# Patient Record
Sex: Female | Born: 1935 | Race: White | Hispanic: No | Marital: Married | Smoking: Never smoker
Health system: Southern US, Community
[De-identification: ages and names within clinical notes are randomized; demographics above are authoritative.]

## PROBLEM LIST (undated history)

## (undated) ENCOUNTER — Emergency Department (HOSPITAL_COMMUNITY): Payer: Medicare Other

## (undated) DIAGNOSIS — T7840XA Allergy, unspecified, initial encounter: Secondary | ICD-10-CM

## (undated) DIAGNOSIS — E039 Hypothyroidism, unspecified: Secondary | ICD-10-CM

## (undated) DIAGNOSIS — E0500 Thyrotoxicosis with diffuse goiter without thyrotoxic crisis or storm: Secondary | ICD-10-CM

## (undated) DIAGNOSIS — M810 Age-related osteoporosis without current pathological fracture: Secondary | ICD-10-CM

## (undated) DIAGNOSIS — Z8679 Personal history of other diseases of the circulatory system: Secondary | ICD-10-CM

## (undated) DIAGNOSIS — Z8619 Personal history of other infectious and parasitic diseases: Secondary | ICD-10-CM

## (undated) DIAGNOSIS — Z8669 Personal history of other diseases of the nervous system and sense organs: Secondary | ICD-10-CM

## (undated) DIAGNOSIS — I10 Essential (primary) hypertension: Secondary | ICD-10-CM

## (undated) DIAGNOSIS — H409 Unspecified glaucoma: Secondary | ICD-10-CM

## (undated) DIAGNOSIS — I739 Peripheral vascular disease, unspecified: Secondary | ICD-10-CM

## (undated) DIAGNOSIS — F29 Unspecified psychosis not due to a substance or known physiological condition: Secondary | ICD-10-CM

## (undated) HISTORY — DX: Essential (primary) hypertension: I10

## (undated) HISTORY — DX: Hypothyroidism, unspecified: E03.9

## (undated) HISTORY — PX: ABDOMINAL HYSTERECTOMY: SHX81

## (undated) HISTORY — DX: Unspecified glaucoma: H40.9

## (undated) HISTORY — PX: TONSILLECTOMY: SUR1361

## (undated) HISTORY — PX: OTHER SURGICAL HISTORY: SHX169

## (undated) HISTORY — PX: DILATION AND CURETTAGE OF UTERUS: SHX78

## (undated) HISTORY — DX: Unspecified psychosis not due to a substance or known physiological condition: F29

## (undated) HISTORY — DX: Allergy, unspecified, initial encounter: T78.40XA

## (undated) HISTORY — DX: Personal history of other infectious and parasitic diseases: Z86.19

## (undated) HISTORY — DX: Personal history of other diseases of the circulatory system: Z86.79

## (undated) HISTORY — DX: Thyrotoxicosis with diffuse goiter without thyrotoxic crisis or storm: E05.00

## (undated) HISTORY — PX: DENTAL SURGERY: SHX609

## (undated) HISTORY — DX: Peripheral vascular disease, unspecified: I73.9

## (undated) HISTORY — DX: Age-related osteoporosis without current pathological fracture: M81.0

## (undated) HISTORY — DX: Personal history of other diseases of the nervous system and sense organs: Z86.69

---

## 1976-10-24 DIAGNOSIS — E0500 Thyrotoxicosis with diffuse goiter without thyrotoxic crisis or storm: Secondary | ICD-10-CM

## 1976-10-24 HISTORY — DX: Thyrotoxicosis with diffuse goiter without thyrotoxic crisis or storm: E05.00

## 1998-08-05 ENCOUNTER — Other Ambulatory Visit: Admission: RE | Admit: 1998-08-05 | Discharge: 1998-08-05 | Payer: Self-pay | Admitting: Obstetrics and Gynecology

## 1999-08-27 ENCOUNTER — Other Ambulatory Visit: Admission: RE | Admit: 1999-08-27 | Discharge: 1999-08-27 | Payer: Self-pay | Admitting: Obstetrics and Gynecology

## 2000-09-22 ENCOUNTER — Other Ambulatory Visit: Admission: RE | Admit: 2000-09-22 | Discharge: 2000-09-22 | Payer: Self-pay | Admitting: Obstetrics and Gynecology

## 2001-09-11 ENCOUNTER — Encounter: Payer: Self-pay | Admitting: Orthopedic Surgery

## 2001-09-11 ENCOUNTER — Encounter: Admission: RE | Admit: 2001-09-11 | Discharge: 2001-09-11 | Payer: Self-pay | Admitting: Orthopedic Surgery

## 2001-12-03 ENCOUNTER — Other Ambulatory Visit: Admission: RE | Admit: 2001-12-03 | Discharge: 2001-12-03 | Payer: Self-pay | Admitting: Obstetrics and Gynecology

## 2002-02-20 ENCOUNTER — Encounter: Payer: Self-pay | Admitting: Orthopedic Surgery

## 2002-02-20 ENCOUNTER — Encounter: Admission: RE | Admit: 2002-02-20 | Discharge: 2002-02-20 | Payer: Self-pay | Admitting: Orthopedic Surgery

## 2002-02-22 ENCOUNTER — Ambulatory Visit (HOSPITAL_BASED_OUTPATIENT_CLINIC_OR_DEPARTMENT_OTHER): Admission: RE | Admit: 2002-02-22 | Discharge: 2002-02-23 | Payer: Self-pay | Admitting: Orthopedic Surgery

## 2002-12-03 ENCOUNTER — Other Ambulatory Visit: Admission: RE | Admit: 2002-12-03 | Discharge: 2002-12-03 | Payer: Self-pay | Admitting: Obstetrics and Gynecology

## 2002-12-31 ENCOUNTER — Encounter: Payer: Self-pay | Admitting: Orthopedic Surgery

## 2002-12-31 ENCOUNTER — Encounter: Admission: RE | Admit: 2002-12-31 | Discharge: 2002-12-31 | Payer: Self-pay | Admitting: Orthopedic Surgery

## 2004-02-04 ENCOUNTER — Other Ambulatory Visit: Admission: RE | Admit: 2004-02-04 | Discharge: 2004-02-04 | Payer: Self-pay | Admitting: Obstetrics and Gynecology

## 2004-06-25 ENCOUNTER — Encounter: Admission: RE | Admit: 2004-06-25 | Discharge: 2004-06-25 | Payer: Self-pay | Admitting: Orthopedic Surgery

## 2004-11-09 ENCOUNTER — Ambulatory Visit: Payer: Self-pay | Admitting: Internal Medicine

## 2004-11-15 ENCOUNTER — Ambulatory Visit: Payer: Self-pay | Admitting: Internal Medicine

## 2005-05-23 ENCOUNTER — Ambulatory Visit: Payer: Self-pay | Admitting: Internal Medicine

## 2005-08-11 ENCOUNTER — Ambulatory Visit: Payer: Self-pay | Admitting: Internal Medicine

## 2005-09-08 ENCOUNTER — Ambulatory Visit: Payer: Self-pay | Admitting: Internal Medicine

## 2005-09-22 ENCOUNTER — Ambulatory Visit: Payer: Self-pay

## 2005-09-22 ENCOUNTER — Encounter: Admission: RE | Admit: 2005-09-22 | Discharge: 2005-09-22 | Payer: Self-pay | Admitting: Obstetrics and Gynecology

## 2006-05-02 ENCOUNTER — Ambulatory Visit: Payer: Self-pay | Admitting: Ophthalmology

## 2006-05-02 ENCOUNTER — Other Ambulatory Visit: Payer: Self-pay

## 2006-05-09 ENCOUNTER — Ambulatory Visit: Payer: Self-pay | Admitting: Ophthalmology

## 2006-07-11 ENCOUNTER — Other Ambulatory Visit: Admission: RE | Admit: 2006-07-11 | Discharge: 2006-07-11 | Payer: Self-pay | Admitting: Obstetrics and Gynecology

## 2006-11-07 ENCOUNTER — Ambulatory Visit: Payer: Self-pay | Admitting: Internal Medicine

## 2007-11-30 ENCOUNTER — Encounter: Payer: Self-pay | Admitting: Internal Medicine

## 2007-12-11 ENCOUNTER — Encounter: Payer: Self-pay | Admitting: Internal Medicine

## 2007-12-13 ENCOUNTER — Encounter: Payer: Self-pay | Admitting: Internal Medicine

## 2008-03-01 ENCOUNTER — Encounter: Payer: Self-pay | Admitting: *Deleted

## 2008-03-01 DIAGNOSIS — Z8619 Personal history of other infectious and parasitic diseases: Secondary | ICD-10-CM

## 2008-03-01 DIAGNOSIS — Z9089 Acquired absence of other organs: Secondary | ICD-10-CM

## 2008-03-01 DIAGNOSIS — I739 Peripheral vascular disease, unspecified: Secondary | ICD-10-CM

## 2008-03-01 DIAGNOSIS — J309 Allergic rhinitis, unspecified: Secondary | ICD-10-CM | POA: Insufficient documentation

## 2008-03-01 DIAGNOSIS — Z8669 Personal history of other diseases of the nervous system and sense organs: Secondary | ICD-10-CM

## 2008-03-01 DIAGNOSIS — E559 Vitamin D deficiency, unspecified: Secondary | ICD-10-CM | POA: Insufficient documentation

## 2008-03-01 DIAGNOSIS — I10 Essential (primary) hypertension: Secondary | ICD-10-CM | POA: Insufficient documentation

## 2008-03-01 DIAGNOSIS — E039 Hypothyroidism, unspecified: Secondary | ICD-10-CM | POA: Insufficient documentation

## 2008-03-01 DIAGNOSIS — Z8679 Personal history of other diseases of the circulatory system: Secondary | ICD-10-CM | POA: Insufficient documentation

## 2008-03-01 DIAGNOSIS — H409 Unspecified glaucoma: Secondary | ICD-10-CM | POA: Insufficient documentation

## 2008-03-01 DIAGNOSIS — Z9889 Other specified postprocedural states: Secondary | ICD-10-CM

## 2008-03-01 DIAGNOSIS — M810 Age-related osteoporosis without current pathological fracture: Secondary | ICD-10-CM | POA: Insufficient documentation

## 2008-04-17 ENCOUNTER — Ambulatory Visit: Payer: Self-pay | Admitting: Internal Medicine

## 2008-04-17 DIAGNOSIS — R7989 Other specified abnormal findings of blood chemistry: Secondary | ICD-10-CM | POA: Insufficient documentation

## 2008-04-17 DIAGNOSIS — R5381 Other malaise: Secondary | ICD-10-CM

## 2008-04-17 DIAGNOSIS — R5383 Other fatigue: Secondary | ICD-10-CM

## 2008-04-17 LAB — CONVERTED CEMR LAB
ALT: 16 units/L (ref 0–35)
AST: 24 units/L (ref 0–37)
Alkaline Phosphatase: 53 units/L (ref 39–117)
Basophils Relative: 0.3 % (ref 0.0–1.0)
Bilirubin, Direct: 0.1 mg/dL (ref 0.0–0.3)
CO2: 29 meq/L (ref 19–32)
Calcium: 9.4 mg/dL (ref 8.4–10.5)
Chloride: 105 meq/L (ref 96–112)
Eosinophils Absolute: 0.1 10*3/uL (ref 0.0–0.7)
Eosinophils Relative: 1.3 % (ref 0.0–5.0)
Folate: >20 ng/mL
Free T4: 1.3 ng/dL (ref 0.6–1.6)
Glucose, Bld: 102 mg/dL — ABNORMAL HIGH (ref 70–99)
HDL: 42.2 mg/dL (ref >39.0)
MCV: 90.8 fL (ref 78.0–100.0)
Monocytes Relative: 8.9 % (ref 3.0–12.0)
Neutrophils Relative %: 65.7 % (ref 43.0–77.0)
Platelets: 268 10*3/uL (ref 150–400)
Potassium: 4.5 meq/L (ref 3.5–5.1)
RBC: 4.31 M/uL (ref 3.87–5.11)
Sodium: 140 meq/L (ref 135–145)
Total Bilirubin: 1 mg/dL (ref 0.3–1.2)
Total CHOL/HDL Ratio: 5.4
Total Protein: 6.8 g/dL (ref 6.0–8.3)
WBC: 5.7 10*3/uL (ref 4.5–10.5)

## 2008-04-21 ENCOUNTER — Encounter: Payer: Self-pay | Admitting: Internal Medicine

## 2008-05-08 ENCOUNTER — Ambulatory Visit: Payer: Self-pay | Admitting: Internal Medicine

## 2008-05-08 DIAGNOSIS — E785 Hyperlipidemia, unspecified: Secondary | ICD-10-CM | POA: Insufficient documentation

## 2008-07-29 ENCOUNTER — Other Ambulatory Visit: Admission: RE | Admit: 2008-07-29 | Discharge: 2008-07-29 | Payer: Self-pay | Admitting: Obstetrics and Gynecology

## 2008-09-29 ENCOUNTER — Ambulatory Visit: Payer: Self-pay | Admitting: Internal Medicine

## 2008-09-29 LAB — CONVERTED CEMR LAB
HDL: 44.6 mg/dL (ref >39.0)
LDL Cholesterol: 119 mg/dL — ABNORMAL HIGH (ref 0–99)
Total CHOL/HDL Ratio: 4.2
Triglycerides: 128 mg/dL (ref 0–149)
VLDL: 26 mg/dL (ref 0–40)

## 2008-10-02 ENCOUNTER — Encounter: Payer: Self-pay | Admitting: Internal Medicine

## 2008-11-13 ENCOUNTER — Ambulatory Visit (HOSPITAL_COMMUNITY): Admission: RE | Admit: 2008-11-13 | Discharge: 2008-11-13 | Payer: Self-pay | Admitting: Obstetrics and Gynecology

## 2009-04-13 ENCOUNTER — Encounter: Payer: Self-pay | Admitting: Internal Medicine

## 2009-05-11 ENCOUNTER — Encounter: Payer: Self-pay | Admitting: Internal Medicine

## 2009-08-21 ENCOUNTER — Ambulatory Visit: Payer: Self-pay | Admitting: Internal Medicine

## 2009-08-25 ENCOUNTER — Ambulatory Visit: Payer: Self-pay | Admitting: Cardiology

## 2009-08-25 ENCOUNTER — Encounter: Payer: Self-pay | Admitting: Internal Medicine

## 2009-08-25 ENCOUNTER — Ambulatory Visit (HOSPITAL_COMMUNITY): Admission: RE | Admit: 2009-08-25 | Discharge: 2009-08-25 | Payer: Self-pay | Admitting: Internal Medicine

## 2009-08-25 ENCOUNTER — Ambulatory Visit: Payer: Self-pay

## 2010-03-17 ENCOUNTER — Telehealth (INDEPENDENT_AMBULATORY_CARE_PROVIDER_SITE_OTHER): Payer: Self-pay

## 2010-08-18 ENCOUNTER — Telehealth: Payer: Self-pay | Admitting: Internal Medicine

## 2010-08-25 ENCOUNTER — Encounter: Payer: Self-pay | Admitting: Internal Medicine

## 2010-11-11 ENCOUNTER — Ambulatory Visit
Admission: RE | Admit: 2010-11-11 | Discharge: 2010-11-11 | Payer: Self-pay | Source: Home / Self Care | Attending: Internal Medicine | Admitting: Internal Medicine

## 2010-11-11 DIAGNOSIS — H8309 Labyrinthitis, unspecified ear: Secondary | ICD-10-CM | POA: Insufficient documentation

## 2010-11-25 NOTE — Assessment & Plan Note (Signed)
Summary: cough/dizzy/lb   Vital Signs:  Patient profile:   77 year old female Height:      63 inches Weight:      158 pounds BMI:     28.09 O2 Sat:      98 % on Room air Temp:     98.0 degrees F oral Pulse rate:   67 / minute BP sitting:   120 / 80  (left arm) Cuff size:   regular  Vitals Entered By: Bill Salinas CMA (November 11, 2010 11:30 AM)  O2 Flow:  Room air CC: pt here with c/o head congestion, coughing and decreased energy/ ab   Primary Care Provider:  Jacques Navy MD  CC:  pt here with c/o head congestion and coughing and decreased energy/ ab.  History of Present Illness: [name] has been sick since Xmas:cough, with paroxysms and near syncope,  productive of thick mucus, frothy, yellow chunks; has been taking tussionex at bedtime and robitussin;no fever; sore throat; no ear pain. Yesterday she had the onset of dizziness - dysequilibrium. She declined meclizine.   Current Medications (verified): 1)  Synthroid 125 Mcg  Tabs (Levothyroxine Sodium) .... Take 1 Tablet By Mouth Once A Day 2)  Lopressor 50 Mg  Tabs (Metoprolol Tartrate) .... Take 1 Tablet By Mouth Two Times A Day 3)  Xalatan 0.005 %  Soln (Latanoprost) .... One Drop Both Eyes At Bedtime 4)  Calcium Citrate-Vitamin D 1500-200 Mg-Unit  Tabs (Calcium Citrate-Vitamin D) .... Three Times A Day 5)  Multivitamins   Tabs (Multiple Vitamin) .... Take Once Daily 6)  Preservision/lutein   Caps (Multiple Vitamins-Minerals) .... Take Two Times A Day 7)  Alphagan P 0.1 %  Soln (Brimonidine Tartrate) .... Use Two Times A Day As Directed. 8)  Cosopt 2-0.5 %  Soln (Dorzolamide-Timolol) .... Use Two Times A Day As Directed. 9)  Tylenol Extra Strength 500 Mg  Tabs (Acetaminophen) .... As Directed As Needed 10)  Lucentis 0.5 Mg/0.62ml  Soln (Ranibizumab) .... Monthly Injection 11)  Genfamycin Injection .... At Bedtime 12)  Actonel 150 Mg Tabs (Risedronate Sodium) .Marland Kitchen.. 1 Q Mth  Allergies (verified): 1)  ! Duricef 2)   ! Vicodin 3)  ! Lidocaine 4)  ! Sulfa  Past History:  Past Medical History: Last updated: 03/01/2008 LYME DISEASE, HX OF (ICD-V12.09) GRAVE'S DISEASE, HX OF IN 1978 (ICD-V12.2) ALLERGIC RHINITIS (ICD-477.9) PERIPHERAL VASCULAR DISEASE (ICD-443.9) OSTEOPOROSIS (ICD-733.00) HYPOTHYROIDISM (ICD-244.9) PALPITATIONS, HX OF (ICD-V12.50) HYPERTENSION (ICD-401.9) VITAMIN D DEFICIENCY (ICD-268.9) RETINAL DETACHMENT, LEFT EYE, HX OF (ICD-V12.49) GLAUCOMA (ICD-365.9)  Past Surgical History: Last updated: 08/21/2009 * Hx of VIRECTOMY ROTATOR CUFF REPAIR, HX OF (ICD-V45.89) DILATION AND CURETTAGE, HX OF (ICD-V45.89) TONSILLECTOMY, HX OF (ICD-V45.79) * BROKEN TOOTH WITH STAGE 4 IMPLANT WITH BONE GRAFT. * Hx of REPAIR OF TORN ANTERIOR LEFT DISTAL LOWER EXTREMITY ARTHROSCOPY, LEFT KNEE, HX OF (ICD-V45.89) LAPRASCOPIC ROBOTIC HYSTERECTOMY - Sept '10 FH reviewed for relevance, SH/Risk Factors reviewed for relevance  Review of Systems       The patient complains of prolonged cough, headaches, and difficulty walking.  The patient denies anorexia, fever, weight loss, weight gain, decreased hearing, chest pain, syncope, dyspnea on exertion, hemoptysis, abdominal pain, hematochezia, incontinence, muscle weakness, and enlarged lymph nodes.         walking diffiuclty due to balance issues  Physical Exam  General:  WNWD overweight white female in NAD Head:  Normocephalic and atraumatic without obvious abnormalities. No sinus tenderness to percussion. Eyes:  C&S clear Ears:  EACs ad TMs normal Mouth:  throat clear Lungs:  normal respiratory effort, normal breath sounds, no crackles, and no wheezes.   Heart:  normal rate and regular rhythm.   Neurologic:  alert & oriented X3, cranial nerves II-XII intact, and gait normal.   Skin:  turgor normal and color normal.   Cervical Nodes:  no anterior cervical adenopathy and no posterior cervical adenopathy.   Psych:  Oriented X3 and memory intact for  recent and remote.     Impression & Recommendations:  Problem # 1:  COUGH (ICD-786.2) Patient with paroxysmal coughing with a tickle in the throat c/w  cyclical cough  Plan - prednisone burst and taper; benzonatate; tussionex bid  Problem # 2:  LABYRINTHITIS, ACUTE (ICD-386.30) Non-focal exam with symptoms c/w  labyrinthits  Plan - meclizine 12.5 mg q6 as needed .   Complete Medication List: 1)  Synthroid 125 Mcg Tabs (Levothyroxine sodium) .... Take 1 tablet by mouth once a day 2)  Lopressor 50 Mg Tabs (Metoprolol tartrate) .... Take 1 tablet by mouth two times a day 3)  Xalatan 0.005 % Soln (Latanoprost) .... One drop both eyes at bedtime 4)  Calcium Citrate-vitamin D 1500-200 Mg-unit Tabs (Calcium citrate-vitamin d) .... Three times a day 5)  Multivitamins Tabs (Multiple vitamin) .... Take once daily 6)  Preservision/lutein Caps (Multiple vitamins-minerals) .... Take two times a day 7)  Alphagan P 0.1 % Soln (Brimonidine tartrate) .... Use two times a day as directed. 8)  Cosopt 2-0.5 % Soln (Dorzolamide-timolol) .... Use two times a day as directed. 9)  Tylenol Extra Strength 500 Mg Tabs (Acetaminophen) .... As directed as needed 10)  Lucentis 0.5 Mg/0.4ml Soln (Ranibizumab) .... Monthly injection 11)  Genfamycin Injection  .... At bedtime 12)  Actonel 150 Mg Tabs (Risedronate sodium) .Marland Kitchen.. 1 q mth 13)  Prednisone 10 Mg Tabs (Prednisone) .... 3 tabs once daily x 1, 2 tabs once daily x 3, 1 tab once daily x 6 14)  Benzonatate 100 Mg Caps (Benzonatate) .Marland Kitchen.. 1 three times a day for cough 15)  Meclizine Hcl 12.5 Mg Tabs (Meclizine hcl) .Marland Kitchen.. 1 by mouth q6 as needed for dizziness 16)  Tussionex Pennkinetic Er 10-8 Mg/58ml Lqcr (Hydrocod polst-chlorphen polst) .Marland Kitchen.. 1 tsp q 12 Prescriptions: TUSSIONEX PENNKINETIC ER 10-8 MG/5ML LQCR (HYDROCOD POLST-CHLORPHEN POLST) 1 tsp q 12  #4 oz x 0   Entered and Authorized by:   Jacques Navy MD   Signed by:   Jacques Navy MD on 11/11/2010    Method used:   Handwritten   RxID:   [id number] MECLIZINE HCL 12.5 MG TABS (MECLIZINE HCL) 1 by mouth q6 as needed for dizziness  #30 x 2   Entered and Authorized by:   Jacques Navy MD   Signed by:   Jacques Navy MD on 11/11/2010   Method used:   Electronically to        [facility]* (retail)       [address]       [place], [place]  [address]       Ph: [phone]       Fax: [phone]   RxID:   [id number] BENZONATATE 100 MG CAPS (BENZONATATE) 1 three times a day for cough  #30 x 1   Entered and Authorized by:   Jacques Navy MD   Signed by:   Jacques Navy MD on 11/11/2010   Method used:  Electronically to        [facility]* (retail)       [address]       [place], [place]  [address]       Ph: [phone]       Fax: [phone]   RxID:   [id number] PREDNISONE 10 MG TABS (PREDNISONE) 3 tabs once daily x 1, 2 tabs once daily x 3, 1 tab once daily x 6  #15 x 0   Entered and Authorized by:   Jacques Navy MD   Signed by:   Jacques Navy MD on 11/11/2010   Method used:   Electronically to        [facility]* (retail)       [address]       [place], [place]  [address]       Ph: [phone]       Fax: [phone]   RxID:   [id number]    Orders Added: 1)  Est. Patient Level III [37628]

## 2010-11-25 NOTE — Consult Note (Signed)
Summary: [facility]  [facility]   Imported By: Lester [place] 09/06/2010 09:54:43  _____________________________________________________________________  External Attachment:    Type:   Image     Comment:   External Document

## 2010-11-25 NOTE — Progress Notes (Signed)
    Immunization History:  Influenza Immunization History:    Influenza:  0.69ml man. sanofi (08/06/2010)

## 2010-11-25 NOTE — Progress Notes (Signed)
Summary: Schedule overdue recall colon  Phone Note Outgoing Call Call back at [facility] Phone [phone]   Call placed by: Darcey Nora RN, CGRN,  Mar 17, 2010 2:48 PM Call placed to: Patient Summary of Call: I called and spoke with the patient about her overdue recall colon.  Patient declines to schedule at this time.  She reports there have been multiple deaths in her family and she is responsible for the wills and estates and doesn't have "time to think about myself".   Initial call taken by: Darcey Nora RN, CGRN,  Mar 17, 2010 2:51 PM

## 2010-12-21 ENCOUNTER — Encounter: Payer: Self-pay | Admitting: Internal Medicine

## 2010-12-21 ENCOUNTER — Ambulatory Visit (INDEPENDENT_AMBULATORY_CARE_PROVIDER_SITE_OTHER): Payer: Medicare Other | Admitting: Internal Medicine

## 2010-12-21 ENCOUNTER — Ambulatory Visit (INDEPENDENT_AMBULATORY_CARE_PROVIDER_SITE_OTHER)
Admission: RE | Admit: 2010-12-21 | Discharge: 2010-12-21 | Disposition: A | Discharge status: Home / Self Care | Payer: Medicare Other | Source: Ambulatory Visit | Attending: Internal Medicine | Admitting: Internal Medicine

## 2010-12-21 ENCOUNTER — Other Ambulatory Visit: Payer: Medicare Other

## 2010-12-21 ENCOUNTER — Other Ambulatory Visit: Payer: Self-pay | Admitting: Internal Medicine

## 2010-12-21 DIAGNOSIS — R1031 Right lower quadrant pain: Secondary | ICD-10-CM

## 2010-12-21 LAB — CBC WITH DIFFERENTIAL/PLATELET
Basophils Absolute: 0 10*3/uL (ref 0.0–0.1)
Eosinophils Relative: 0.5 % (ref 0.0–5.0)
HCT: 40.2 % (ref 36.0–46.0)
Lymphs Abs: 1.3 10*3/uL (ref 0.7–4.0)
Monocytes Relative: 7.9 % (ref 3.0–12.0)
Neutrophils Relative %: 68.1 % (ref 43.0–77.0)
Platelets: 255 10*3/uL (ref 150.0–400.0)
RDW: 14.3 % (ref 11.5–14.6)
WBC: 5.8 10*3/uL (ref 4.5–10.5)

## 2010-12-21 LAB — BASIC METABOLIC PANEL
CO2: 30 mEq/L (ref 19–32)
Calcium: 9.2 mg/dL (ref 8.4–10.5)
GFR: 84.29 mL/min (ref >60.00)
Potassium: 4.5 mEq/L (ref 3.5–5.1)
Sodium: 140 mEq/L (ref 135–145)

## 2010-12-21 MED ORDER — IOHEXOL 300 MG/ML  SOLN
80.0000 mL | Freq: Once | INTRAMUSCULAR | Status: AC | PRN
  Administered 2010-12-21: 80 mL via INTRAVENOUS

## 2010-12-30 NOTE — Assessment & Plan Note (Signed)
Summary: STOMACH PAIN / PAIN IN ARM AND SHOULDER/NWS   Vital Signs:  Patient profile:   77 year old female Height:      63 inches Weight:      158 pounds BMI:     28.09 O2 Sat:      97 % on Room air Temp:     97.9 degrees F oral Pulse rate:   77 / minute BP sitting:   128 / 80  (left arm) Cuff size:   regular  Vitals Entered By: Bill Salinas CMA (December 21, 2010 10:28 AM)  O2 Flow:  Room air CC: pt here with c/o abd pain x 3 days. pt denies diarrhea, constipation, vomitting or diarrhea/ ab   Primary Care Provider:  Jacques Navy MD  CC:  pt here with c/o abd pain x 3 days. pt denies diarrhea, constipation, and vomitting or diarrhea/ ab.  History of Present Illness: Kathryn Haynes reports that she had a bout of reflux 3 days ago and since that time has had severe pain in the RLQ with radiation to the umbilicus. she had a similar episode 1 month ago. Her pain is worse with meals. She describes the pain as a low grade constant ache. She does get some relief with simethicone. She reports that her bowels are moving well. She has not had any melena or hematochezia, no hemetemesis. She has had no fever or chills.   Current Medications (verified): 1)  Synthroid 125 Mcg  Tabs (Levothyroxine Sodium) .... Take 1 Tablet By Mouth Once A Day 2)  Lopressor 50 Mg  Tabs (Metoprolol Tartrate) .... Take 1 Tablet By Mouth Two Times A Day 3)  Xalatan 0.005 %  Soln (Latanoprost) .... One Drop Both Eyes At Bedtime 4)  Calcium Citrate-Vitamin D 1500-200 Mg-Unit  Tabs (Calcium Citrate-Vitamin D) .... Three Times A Day 5)  Multivitamins   Tabs (Multiple Vitamin) .... Take Once Daily 6)  Preservision/lutein   Caps (Multiple Vitamins-Minerals) .... Take Two Times A Day 7)  Alphagan P 0.1 %  Soln (Brimonidine Tartrate) .... Use Two Times A Day As Directed. 8)  Cosopt 2-0.5 %  Soln (Dorzolamide-Timolol) .... Use Two Times A Day As Directed. 9)  Tylenol Extra Strength 500 Mg  Tabs (Acetaminophen) .... As  Directed As Needed 10)  Lucentis 0.5 Mg/0.29ml  Soln (Ranibizumab) .... Monthly Injection 11)  Genfamycin Injection .... At Bedtime 12)  Actonel 150 Mg Tabs (Risedronate Sodium) .Marland Kitchen.. 1 Q Mth 13)  Prednisone 10 Mg Tabs (Prednisone) .... 3 Tabs Once Daily X 1, 2 Tabs Once Daily X 3, 1 Tab Once Daily X 6 14)  Benzonatate 100 Mg Caps (Benzonatate) .Marland Kitchen.. 1 Three Times A Day For Cough 15)  Meclizine Hcl 12.5 Mg Tabs (Meclizine Hcl) .Marland Kitchen.. 1 By Mouth Q6 As Needed For Dizziness 16)  Tussionex Pennkinetic Er 10-8 Mg/30ml Lqcr (Hydrocod Polst-Chlorphen Polst) .Marland Kitchen.. 1 Tsp Q 12  Allergies (verified): 1)  ! Duricef 2)  ! Vicodin 3)  ! Lidocaine 4)  ! Sulfa  Past History:  Past Medical History: Last updated: 03/01/2008 LYME DISEASE, HX OF (ICD-V12.09) GRAVE'S DISEASE, HX OF IN 1978 (ICD-V12.2) ALLERGIC RHINITIS (ICD-477.9) PERIPHERAL VASCULAR DISEASE (ICD-443.9) OSTEOPOROSIS (ICD-733.00) HYPOTHYROIDISM (ICD-244.9) PALPITATIONS, HX OF (ICD-V12.50) HYPERTENSION (ICD-401.9) VITAMIN D DEFICIENCY (ICD-268.9) RETINAL DETACHMENT, LEFT EYE, HX OF (ICD-V12.49) GLAUCOMA (ICD-365.9)  Past Surgical History: Last updated: 08/21/2009 * Hx of VIRECTOMY ROTATOR CUFF REPAIR, HX OF (ICD-V45.89) DILATION AND CURETTAGE, HX OF (ICD-V45.89) TONSILLECTOMY, HX OF (ICD-V45.79) * BROKEN TOOTH  WITH STAGE 4 IMPLANT WITH BONE GRAFT. * Hx of REPAIR OF TORN ANTERIOR LEFT DISTAL LOWER EXTREMITY ARTHROSCOPY, LEFT KNEE, HX OF (ICD-V45.89) LAPRASCOPIC ROBOTIC HYSTERECTOMY - Sept '10  Family History: Last updated: 04/17/2008 Mother, Grandmother and sister with osteoporosis  Social History: Last updated: 04/17/2008 Patient and husband doing well. Patient states that they are always on the go.  Review of Systems       The patient complains of anorexia, abdominal pain, and severe indigestion/heartburn.  The patient denies fever, weight loss, weight gain, chest pain, syncope, dyspnea on exertion, melena, hematochezia,  incontinence, difficulty walking, abnormal bleeding, and enlarged lymph nodes.    Physical Exam  General:  alert, well-developed, and well-nourished older white woman in no acute distress.   Head:  normocephalic and atraumatic.   Eyes:  sclera clear Neck:  supple.   Lungs:  normal respiratory effort and normal breath sounds.   Heart:  normal rate and regular rhythm.   Abdomen:  soft and normal bowel sounds.  Tender to palpation in the epigastrum and in the right lower quadrant at mcBurney's point. No guarding or rebound. Mild tenderness on the LLQ.  Msk:  normal ROM.   Pulses:  2+ radial Neurologic:  alert & oriented X3, cranial nerves II-XII intact, and gait normal.   Skin:  turgor normal and color normal.   Cervical Nodes:  no anterior cervical adenopathy and no posterior cervical adenopathy.   Psych:  memory intact for recent and remote, normally interactive, and good eye contact.     Impression & Recommendations:  Problem # 1:  ABDOMINAL PAIN, RIGHT LOWER QUADRANT (ICD-789.03) Patient's constellation of symptoms are worrisome for appendicitis.  Plan - lab-CBC, BMet          CT abdomen/pelvis r/o appendicitis.  Orders: TLB-CBC Platelet - w/Differential (85025-CBCD) TLB-BMP (Basic Metabolic Panel-BMET) (80048-METABOL) Radiology Referral (Radiology)  Addendum - CT negative.  Paatient may have dyspepsia although her symptoms may represent mild mesenteric ischemia.  Plan - trial of H2 blocker at full dose two times a day.           if her symptoms do not improve - she will call.  Complete Medication List: 1)  Synthroid 125 Mcg Tabs (Levothyroxine sodium) .... Take 1 tablet by mouth once a day 2)  Lopressor 50 Mg Tabs (Metoprolol tartrate) .... Take 1 tablet by mouth two times a day 3)  Xalatan 0.005 % Soln (Latanoprost) .... One drop both eyes at bedtime 4)  Calcium Citrate-vitamin D 1500-200 Mg-unit Tabs (Calcium citrate-vitamin d) .... Three times a day 5)  Multivitamins  Tabs (Multiple vitamin) .... Take once daily 6)  Preservision/lutein Caps (Multiple vitamins-minerals) .... Take two times a day 7)  Alphagan P 0.1 % Soln (Brimonidine tartrate) .... Use two times a day as directed. 8)  Cosopt 2-0.5 % Soln (Dorzolamide-timolol) .... Use two times a day as directed. 9)  Tylenol Extra Strength 500 Mg Tabs (Acetaminophen) .... As directed as needed 10)  Lucentis 0.5 Mg/0.93ml Soln (Ranibizumab) .... Monthly injection 11)  Genfamycin Injection  .... At bedtime 12)  Actonel 150 Mg Tabs (Risedronate sodium) .Marland Kitchen.. 1 q mth 13)  Prednisone 10 Mg Tabs (Prednisone) .... 3 tabs once daily x 1, 2 tabs once daily x 3, 1 tab once daily x 6 14)  Benzonatate 100 Mg Caps (Benzonatate) .Marland Kitchen.. 1 three times a day for cough 15)  Meclizine Hcl 12.5 Mg Tabs (Meclizine hcl) .Marland Kitchen.. 1 by mouth q6 as needed for dizziness 16)  Tussionex Pennkinetic Er 10-8 Mg/10ml Lqcr (Hydrocod polst-chlorphen polst) .Marland Kitchen.. 1 tsp q 12   Orders Added: 1)  TLB-CBC Platelet - w/Differential [85025-CBCD] 2)  TLB-BMP (Basic Metabolic Panel-BMET) [80048-METABOL] 3)  Radiology Referral [Radiology] 4)  Est. Patient Level IV [96295]

## 2011-02-22 ENCOUNTER — Other Ambulatory Visit: Payer: Self-pay | Admitting: Internal Medicine

## 2011-03-11 NOTE — Op Note (Signed)
[place]  Patient:    EVEE, LISKA Visit Number: [id number] MRN: [id number]          Service Type: DSU Location: [facility] Attending Physician:  Teena Dunk Dictated by:   Sharlot Gowda., M.D. Proc. Date: 02/22/02 Admit Date:  02/22/2002 Discharge Date: 02/22/2002                             Operative Report  PREOPERATIVE DIAGNOSIS: 1. Complete tear of rotator cuff (junction of supra- and infraspinatus). 2. Impingement acromioclavicular joint arthritis. 3. Degenerative tearing of anterior and superior labrum.  POSTOPERATIVE DIAGNOSIS: 1. Complete tear of rotator cuff (junction of supra- and infraspinatus). 2. Impingement acromioclavicular joint arthritis. 3. Degenerative tearing of anterior and superior labrum.  OPERATION PERFORMED: 1. Open rotator cuff repair and acromioplasty. 2. Arthroscopic debridement of torn labrum. 3. Open distal clavicle excision.  SURGEON:  Sharlot Gowda., M.D.  ASSISTANT:  Jamelle Rushing, P.A.  ANESTHESIA:  INDICATIONS FOR PROCEDURE:  The patient is a 77 year old with a partial rotator cuff tear diagnosed several months ago with increasing pain felt to be amenable to arthroscopy and partial open repair.  DESCRIPTION OF PROCEDURE:  Arthroscoped in a sitting beach chair position, posterior lateral, anterior portals.  Systematic inspection of the shoulder showed the patient actually had a complete tear of probably more the leading edge of the infraspinatus with a longitudinal split between the supraspinatus and infraspinatus.  The tendon was degenerative.  Degenerative tearing of the labrum was debrided with the arthroscope in view of the complete tear, it was elected to open the shoulder at that point.  Incision was made dissecting the acromion AC joint.  The [facility] joint was significantly degenerative.  It was excised over 1 cm to 1.5 cm.  The acromion was very thickened and  overhanging. Probably 1 cm of acromion was excised anteriorly and actually there was a prominence towards the posterior lateral aspect of the acromion which was burred.  The tear was revealed. There was an extreme amount of bursal inflammation.  Bursectomy carried out freshening the edge of the cuff, in fact a dog ear so to speak of the infraspinatus had to be removed in the sense of a degenerative edge.  Again, end-to-end suture was used on the longitudinal component with a #2 fiberwire suture, followed by placement of an ArthRx anchor with two sutures, one placed on the anterior leading edge, more at the supraspinatus and the other on the more prominent infraspinatus which created essentially but not quite a watertight repair.  Excellent repair was obtained, given the preoperative status of the shoulder.  The wound was irrigated and closed with #1 Tycron on the deltoid, 2-0 Vicryl in the subcutaneous tissues and the skin with Monocryl.  No "caine" drugs were used on the patient due to allergy.  A lightly compressive sterile dressing applied. Dictated by:   Sharlot Gowda., M.D. Attending Physician:  Teena Dunk DD:  02/22/02 TD:  02/25/02 Job: [id number]

## 2011-03-11 NOTE — Assessment & Plan Note (Signed)
[facility]                           PRIMARY CARE OFFICE NOTE   JACQUELINNE, SPEAK                      MRN:          [id number]  DATE:11/07/2006                            DOB:          [birth year]    DATE OF BIRTH:  09/23/1929.   SUBJECTIVE:  Ms. Kathryn Haynes is a delightful 77 year old woman who is  presenting for a follow-up evaluation and exam.  She was last seen in  the office on September 08, 2005, by Dr. Barbette Hair. Artist Pais, when she  presented with left lower extremity edema and bilateral lower extremity  discomfort.   INTERVAL HISTORY:  1. Orthopedic:  The patient underwent an arthroscopic repair of a torn      meniscus in the left knee.  2. Orthopedic:  The patient had a repair of a torn anterior left      distal lower extremity tendon/ligament, with surgery performed at      [facility] by Dr. Helen Hashimoto in October 2007, with the patient just      coming out of a walking cast in the last three weeks.  3. Ophthalmology:  The patient with a retinal detachment on the left      with complications requiring an operative repair and steroid      injection.  The patient with major vision loss, as well as question      of proptosis and lid lag.  4. Ophthalmology:  The patient with increased glaucoma with change in      medication.  5. Dental:  The patient with a broken tooth, now getting stage 4      implant, having had bone graft with pin to come.  6. Metabolic:  The patient with vitamin D deficiency, diagnosed by her      gynecologist, Dr. Artist Pais, with the patient being given high      doses of Calciferol for replacement.   PAST MEDICAL HISTORY:  Is well-documented in my last exam note of  November 15, 2004.  Please see that complete dictation.   FAMILY HISTORY:  Noncontributory.   SOCIAL HISTORY:  The patient and her husband are doing well.  He has  been a good caretaker for her, although he is not a very good cook.   PHYSICIAN ROSTER:  1. GYN:  Dr. Artist Pais.  2. Orthopedics:  Dr. Otelia Sergeant and Dr. Fayrene Fearing Dioreo at [facility].  3. Ophthalmology:  Dr. David Stall in [place] is her primary      ophthalmologist with several consultants in [place].   CURRENT MEDICATIONS:  1. Lopressor 50 mg b.i.d.  2. Synthroid 125 mcg daily.  3. Xalatan drops.  4. Alphagan drops.  5. Cosopt drops.  6. Preserve vision tab b.i.d.  7. Calcium with vitamin D 1500 mg daily.  8. Multivitamin daily.  9. Ibuprofen p.r.n.   HEALTH MAINTENANCE:  The patient's last Pap smear was in September 2007.  Last mammogram was in November 2006.  Last colonoscopy was in March  2005, by Dr. Judie Petit T. Russella Dar.  The report not available on the chart.  CHART REVIEW:  1. Last stress test Cardiolite study was in April 1997.  This was a      normal study.  2. Last lower extremity venous exam was on August 26, 2005, with no      sign of deep venous thrombosis.  3. Last lower extremity arterial exam was in 1999.  4. Last mammogram report from November 2006, was negative.  5. Last chest x-ray from January 2006, was unremarkable, with no      active disease.  6. Last DEXA-scan on the chart is from October 2004, with a T-score of      -2.6 at the AP spine, -2.5 at the femoral neck.   REVIEW OF SYSTEMS:  The patient has had no constitutional changes.  Ophthalmology:  Per the present history.  No cardiovascular,  respiratory, GI, genitourinary or musculoskeletal problems, except as  noted above.  Skin is clear.   LIMITED PHYSICAL EXAMINATION:  VITAL SIGNS:  Temperature 98.3 degrees,  blood pressure 117/67, pulse 76, weight 156 pounds.  GENERAL:  A well-developed and well-nourished woman, in no acute  distress.  HEENT:  Normocephalic and atraumatic.  EACs and tympanic membranes were  normal.  Oropharynx without buccal or palatal lesions.  Posterior  pharynx clear.  Conjunctivae and sclerae clear.  Further exam deferred  to ophthalmology.  NECK:   Supple without thyromegaly.  NODES:  No adenopathy was noted in the cervical or supraclavicular  regions.  CHEST:  No CVA tenderness.  LUNGS:  Clear to auscultation and percussion.  BREASTS:  Exam deferred to gynecology.  CARDIOVASCULAR:  With 2+ radial pulses.  No jugular venous distention or  carotid bruits.  She had a quiet precordium with a regular rate and  rhythm, without murmurs, rubs or gallops.  ABDOMEN:  Soft, no guarding or rebound.  No organomegaly, no  splenomegaly was noted.  PELVIC:  Deferred to gynecology.  RECTAL:  Deferred to GI.  EXTREMITIES:  Upper extremities were unremarkable.  Lower extremities:  The patient has a well-healed surgical scar extending from just above  the medial malleolus down to the great toe.  A second scar on the  lateral aspect of her foot.  Also a heel scar.  There is no erythema.  The wounds appear well-healed.  There is some mild swelling in this  area.  NEUROLOGIC:  Grossly normal.   LABORATORY DATA:  The patient's TSH was slightly low at 0.23.   ASSESSMENT/PLAN:  1. Hypertension:  The patient's blood pressure is very well-      controlled.  2. Palpitations:  The patient is well-controlled with her Lopressor.  3. Hypothyroid disease:  The patient is minimally over-corrected.  At      this point would continue her present dose of Synthroid at 125 mcg      daily.  4. Orthopedic:  The patient is making a good recovery from surgical      repairs, as noted above.  5. Osteoporosis:  The patient is currently off of Actonel while      awaiting a final dental surgery, for fear of osteonecrosis.  She is      getting calcium with vitamin D and vitamin D supplementation.  She      will resume her Actonel when cleared by her surgeon.  6. Ophthalmology:  The patient is still under the care of      ophthalmology for recurrent detached retina on the left and      glaucoma. 7. Peripheral vascular  disease:  The patient with mild claudication,       currently doing well.  8. Allergic rhinitis:  The patient is provided refill prescription for      Allegra.  9. Health maintenance:  The patient is currently up-to-date with her      gynecologist.  She does need to schedule her mammogram.  She is      current with colorectal cancer screening.   In summary, this is a very pleasant woman who has had a tough year  medically, but seems to be doing well at this time, and hopefully 2008,  will be a better year.  The patient is asked to return to see me on a  p.r.n. basis.     Rosalyn Gess Norins, MD  Electronically Signed    MEN/MedQ  DD: 11/08/2006  DT: 11/08/2006  Job #: [id number]   cc:   David Stall, M.D.  Helen Hashimoto, M.D.  Artist Pais, M.D.

## 2011-05-18 ENCOUNTER — Other Ambulatory Visit: Payer: Self-pay | Admitting: Internal Medicine

## 2011-06-17 ENCOUNTER — Other Ambulatory Visit: Payer: Self-pay | Admitting: Internal Medicine

## 2012-01-23 ENCOUNTER — Other Ambulatory Visit: Payer: Self-pay | Admitting: Internal Medicine

## 2012-06-28 ENCOUNTER — Ambulatory Visit (INDEPENDENT_AMBULATORY_CARE_PROVIDER_SITE_OTHER): Payer: Medicare Other | Admitting: Internal Medicine

## 2012-06-28 ENCOUNTER — Other Ambulatory Visit (INDEPENDENT_AMBULATORY_CARE_PROVIDER_SITE_OTHER): Payer: Medicare Other

## 2012-06-28 ENCOUNTER — Encounter: Payer: Self-pay | Admitting: Internal Medicine

## 2012-06-28 VITALS — BP 132/64 | HR 71 | Temp 98.1°F | Resp 16 | Wt 135.0 lb

## 2012-06-28 DIAGNOSIS — IMO0002 Reserved for concepts with insufficient information to code with codable children: Secondary | ICD-10-CM

## 2012-06-28 DIAGNOSIS — E785 Hyperlipidemia, unspecified: Secondary | ICD-10-CM

## 2012-06-28 DIAGNOSIS — D649 Anemia, unspecified: Secondary | ICD-10-CM

## 2012-06-28 DIAGNOSIS — E039 Hypothyroidism, unspecified: Secondary | ICD-10-CM

## 2012-06-28 DIAGNOSIS — N8111 Cystocele, midline: Secondary | ICD-10-CM

## 2012-06-28 DIAGNOSIS — I10 Essential (primary) hypertension: Secondary | ICD-10-CM

## 2012-06-28 DIAGNOSIS — M810 Age-related osteoporosis without current pathological fracture: Secondary | ICD-10-CM

## 2012-06-28 DIAGNOSIS — Z0000 Encounter for general adult medical examination without abnormal findings: Secondary | ICD-10-CM

## 2012-06-28 DIAGNOSIS — Z23 Encounter for immunization: Secondary | ICD-10-CM

## 2012-06-28 LAB — CBC WITH DIFFERENTIAL/PLATELET
Basophils Absolute: 0 10*3/uL (ref 0.0–0.1)
Eosinophils Absolute: 0.1 10*3/uL (ref 0.0–0.7)
HCT: 38.4 % (ref 36.0–46.0)
Lymphs Abs: 1.4 10*3/uL (ref 0.7–4.0)
MCHC: 32.6 g/dL (ref 30.0–36.0)
MCV: 89.6 fl (ref 78.0–100.0)
Monocytes Absolute: 0.4 10*3/uL (ref 0.1–1.0)
Neutrophils Relative %: 64.5 % (ref 43.0–77.0)
Platelets: 232 10*3/uL (ref 150.0–400.0)
RDW: 15.2 % — ABNORMAL HIGH (ref 11.5–14.6)

## 2012-06-28 LAB — HEPATIC FUNCTION PANEL
ALT: 12 U/L (ref 0–35)
AST: 17 U/L (ref 0–37)
Alkaline Phosphatase: 45 U/L (ref 39–117)
Total Bilirubin: 0.3 mg/dL (ref 0.3–1.2)

## 2012-06-28 LAB — COMPREHENSIVE METABOLIC PANEL
AST: 17 U/L (ref 0–37)
Alkaline Phosphatase: 45 U/L (ref 39–117)
BUN: 13 mg/dL (ref 6–23)
Glucose, Bld: 90 mg/dL (ref 70–99)
Sodium: 138 mEq/L (ref 135–145)
Total Bilirubin: 0.3 mg/dL (ref 0.3–1.2)

## 2012-06-28 LAB — LIPID PANEL
Cholesterol: 200 mg/dL (ref 0–200)
HDL: 58.3 mg/dL (ref >39.00)
LDL Cholesterol: 127 mg/dL — ABNORMAL HIGH (ref 0–99)
VLDL: 14.4 mg/dL (ref 0.0–40.0)

## 2012-06-28 LAB — TSH: TSH: 0.21 u[IU]/mL — ABNORMAL LOW (ref 0.35–5.50)

## 2012-06-28 NOTE — Progress Notes (Signed)
Subjective:    Patient ID: Kathryn Haynes, female    DOB: 1928/12/26, 78 y.o.   MRN: [id number]  HPI The patient is here for annual Medicare wellness examination and management of other chronic and acute problems.  In the interrval she has had to have gyn surgery in March for collapse of vaginal vault, prolapse; -post op complications with hypothermia and sedation. She then had urologic surgery for incontinence with implantation of a device in the abdomen.  She just had dental surgery for loss of a crown and tooth. She is ready to be done with all this surgery and recovery.    The risk factors are reflected in the social history.  The roster of all physicians providing medical care to patient - is listed in the Snapshot section of the chart.  Activities of daily living:  The patient is 100% inedpendent in all ADLs: dressing, toileting, feeding as well as independent mobility  Home safety : The patient has smoke detectors in the home. Fall - home is fall safe with grab bar in shower. They wear seatbelts. No firearms at home   There is no risks for hepatitis, STDs or HIV. There is no   history of blood transfusion. They have no travel history to infectious disease endemic areas of the world.  The patient has seen their dentist in the last six month. They have seen their eye doctor in the last year. They deny any hearing difficulty and have not had audiologic testing in the last year.    They do not  have excessive sun exposure. Discussed the need for sun protection: hats, long sleeves and use of sunscreen if there is significant sun exposure.   Diet: the importance of a healthy diet is discussed. They do have a healthy diet.  The patient has no regular exercise program.  The benefits of regular aerobic exercise were discussed.  Depression screen: there are no signs or vegative symptoms of depression- irritability, change in appetite, anhedonia, sadness/tearfullness.  Cognitive assessment:  the patient manages all their financial and personal affairs and is actively engaged. They could relate day,date,year and events; recalled 3/3 objects at 3 minutes; performed clock-face test normally.  The following portions of the patient's history were reviewed and updated as appropriate: allergies, current medications, past family history, past medical history,  past surgical history, past social history  and problem list.  Vision, hearing, body mass index were assessed and reviewed.   During the course of the visit the patient was educated and counseled about appropriate screening and preventive services including : fall prevention , diabetes screening, nutrition counseling, colorectal cancer screening, and recommended immunizations.  Past Medical History:    Reviewed history from 03/01/2008 and no changes required:       LYME DISEASE, HX OF (ICD-V12.09)       GRAVE'S DISEASE, HX OF IN 1978 (ICD-V12.2)       ALLERGIC RHINITIS (ICD-477.9)       PERIPHERAL VASCULAR DISEASE (ICD-443.9)       OSTEOPOROSIS (ICD-733.00)       HYPOTHYROIDISM (ICD-244.9)       PALPITATIONS, HX OF (ICD-V12.50)       HYPERTENSION (ICD-401.9)       VITAMIN D DEFICIENCY (ICD-268.9)       RETINAL DETACHMENT, LEFT EYE, HX OF (ICD-V12.49)       GLAUCOMA (ICD-365.9)  Past Surgical History:    Reviewed history from 03/01/2008 and no changes required:       *  Hx of VIRECTOMY       ROTATOR CUFF REPAIR, HX OF (ICD-V45.89)       DILATION AND CURETTAGE, HX OF (ICD-V45.89)       TONSILLECTOMY, HX OF (ICD-V45.79)       * BROKEN TOOTH WITH STAGE 4 IMPLANT WITH BONE GRAFT.       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 Married '57 -. 1 dtr - '59, 1 son - '62. 1 grandchild. 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 They live  together and remain independent. ACP- yes-CPR, yes - short-term mechanical ventilation, does not want prolonged futile or heroic measures. Quality of life over duration.     Patient and husband doing well.    Patient states that they are always on the go.  Review of Systems System review is negative for any constitutional, cardiac, pulmonary, GI or neuro symptoms or complaints other than as described in the HPI.     Objective:   Physical Exam Filed Vitals:   06/28/12 1314  BP: 132/64  Pulse: 71  Temp: 98.1 F (36.7 C)  Resp: 16   Wt Readings from Last 3 Encounters:  06/28/12 135 lb (61.236 kg)  12/21/10 158 lb (71.668 kg)  11/11/10 158 lb (71.668 kg)   Gen'l: well nourished, well developed white woman in no distress HEENT - [place]/AT, EACs/TMs normal, oropharynx with native dentition in good condition, no buccal or palatal lesions, posterior pharynx clear, mucous membranes moist. C&S clear, PERRLA, fundi - normal Neck - supple, no thyromegaly Nodes- negative submental, cervical, supraclavicular regions Chest - moderate kyphosis, no CVAT Lungs - clear without rales, wheezes. No increased work of breathing Breast -deferred Cardiovascular - regular rate and rhythm, quiet precordium, no murmurs, rubs or gallops, 2+ radial, DP and PT pulses Abdomen - BS+ x 4, no HSM, no guarding or rebound or tenderness Pelvic - deferred to gyn Rectal - deferred to gyn Extremities - no clubbing, cyanosis, edema or deformity.  Neuro - A&O x 3, CN II-XII normal, motor strength normal and equal, DTRs 2+ and symmetrical biceps, radial, and patellar tendons. Cerebellar - no tremor, no rigidity, fluid movement and normal gait. Derm - Head, neck, back, abdomen and extremities without suspicious lesions  Lab Results  Component Value Date   WBC 5.6 06/28/2012   HGB 12.5 06/28/2012   HCT 38.4 06/28/2012   PLT 232.0 06/28/2012   GLUCOSE 90 06/28/2012   CHOL 200 06/28/2012   TRIG 72.0 06/28/2012   HDL 58.30 06/28/2012    LDLDIRECT 156.9 04/17/2008   LDLCALC 127* 06/28/2012        ALT 12 06/28/2012   AST 17 06/28/2012             K 4.6 06/28/2012   CL 105 06/28/2012   CREATININE 0.8 06/28/2012   BUN 13 06/28/2012   CO2 29 06/28/2012   TSH 0.21* 06/28/2012         Assessment & Plan:

## 2012-07-01 ENCOUNTER — Encounter: Payer: Self-pay | Admitting: Internal Medicine

## 2012-07-01 DIAGNOSIS — IMO0002 Reserved for concepts with insufficient information to code with codable children: Secondary | ICD-10-CM | POA: Insufficient documentation

## 2012-07-01 DIAGNOSIS — Z0000 Encounter for general adult medical examination without abnormal findings: Secondary | ICD-10-CM | POA: Insufficient documentation

## 2012-07-01 NOTE — Assessment & Plan Note (Signed)
LDL is better than goal of 130 for low-moderate risk patient. No indication for medical therapy.

## 2012-07-01 NOTE — Assessment & Plan Note (Signed)
BP Readings from Last 3 Encounters:  06/28/12 132/64  12/21/10 128/80  11/11/10 120/80   Very good control of BP  Plan  Continue present regimen

## 2012-07-01 NOTE — Assessment & Plan Note (Signed)
No DEXA on file.  Plan - at her convenience will need to get DEXA scan with recommendations for treatment to follow.

## 2012-07-01 NOTE — Assessment & Plan Note (Signed)
Interval medical history notable for complex GU/Gyn surgery with long recovery - now doing ok. Limited physical exam is normal. Lab results are in normal range. She is current for colorectal cancer screening and has had breast screening. Immunizations are brought up to date.  In summary - a very nice woman who seems to be medically stable at this time. She will return as needed or in 6 months.

## 2012-07-03 ENCOUNTER — Encounter: Payer: Self-pay | Admitting: Internal Medicine

## 2012-07-17 ENCOUNTER — Other Ambulatory Visit: Payer: Self-pay | Admitting: Internal Medicine

## 2012-07-17 ENCOUNTER — Ambulatory Visit (INDEPENDENT_AMBULATORY_CARE_PROVIDER_SITE_OTHER): Payer: Medicare Other | Admitting: Internal Medicine

## 2012-07-17 ENCOUNTER — Ambulatory Visit (HOSPITAL_COMMUNITY)
Admission: RE | Admit: 2012-07-17 | Discharge: 2012-07-17 | Disposition: A | Discharge status: Home / Self Care | Payer: Medicare Other | Source: Ambulatory Visit | Attending: Internal Medicine | Admitting: Internal Medicine

## 2012-07-17 ENCOUNTER — Telehealth: Payer: Self-pay | Admitting: Internal Medicine

## 2012-07-17 ENCOUNTER — Encounter: Payer: Self-pay | Admitting: Internal Medicine

## 2012-07-17 VITALS — BP 144/80 | HR 74 | Temp 97.7°F | Resp 12

## 2012-07-17 DIAGNOSIS — S0990XA Unspecified injury of head, initial encounter: Secondary | ICD-10-CM

## 2012-07-17 DIAGNOSIS — IMO0002 Reserved for concepts with insufficient information to code with codable children: Secondary | ICD-10-CM | POA: Insufficient documentation

## 2012-07-17 DIAGNOSIS — R279 Unspecified lack of coordination: Secondary | ICD-10-CM | POA: Insufficient documentation

## 2012-07-17 DIAGNOSIS — M899 Disorder of bone, unspecified: Secondary | ICD-10-CM | POA: Insufficient documentation

## 2012-07-17 DIAGNOSIS — R4789 Other speech disturbances: Secondary | ICD-10-CM | POA: Insufficient documentation

## 2012-07-17 DIAGNOSIS — I672 Cerebral atherosclerosis: Secondary | ICD-10-CM | POA: Insufficient documentation

## 2012-07-17 NOTE — Telephone Encounter (Signed)
Caller: Jannifer/Patient; Patient Name: Kathryn Haynes; PCP: Illene Regulus (Adults only); Best Callback Phone Number: [phone]  [place]y [place]at at church on 07[place]15[place]12 (ankle turned) and was hit on head and knee by large wooden ladder. She had 1 inch raised swelling that extended the whole length top of head. Applied ice. Seen today by Orthopedic MD and given Cortisone shot in knee-07/17/12. Since the injury she has felt apprensive, had some shaking of her hands and is nervous with her balance, and tingling above and below L eye. Triage and Care advice per Head Injury Protocol and ED disposition for Head injury and 76 years of age or older. Spoke with Fannie Knee, RN in office and Dr. Debby Bud would like her to come in to be checked now. They will arrive in about 45 min.

## 2012-07-17 NOTE — Patient Instructions (Addendum)
Head trauma - exam is normal except for poor balance and a slight problem with word finding and name recall. There is also a very subtle tremor. You most likely have symptoms of a minor concussion. There is a remote possibility of a subdural hematoma.  Plan CT head without contrast tonight at [facility] ED - I have called and they are expecting you. Do not check in to the ED only register and get CT  Watch for increased problems with balance, increased trouble with word finding or any increase in tremor or somnolence  May take tylenol for any pain or discomfort.  Call if your symptoms don't improve.  Concussion and Brain Injury A blow or jolt to the head can disrupt the normal function of the brain. This type of brain injury is often called a "concussion" or a "closed head injury." Concussions are usually not life-threatening. Even so, the effects of a concussion can be serious.   CAUSES   A concussion is caused by a blunt blow to the head. The blow might be direct or indirect as described below.  Direct blow (running into another player during a soccer game, being hit in a fight, or hitting your head on a hard surface).   Indirect blow (when your head moves rapidly and violently back and forth like in a car crash).  SYMPTOMS   The brain is very complex. Every head injury is different. Some symptoms may appear right away. Other symptoms may not show up for days or weeks after the concussion. The signs of concussion can be hard to notice. Early on, problems may be missed by patients, family members, and caregivers. You may look fine even though you are acting or feeling differently.   These symptoms are usually temporary, but may last for days, weeks, or even longer. Symptoms include:  Mild headaches that will not go away.   Having more trouble than usual with:   Remembering things.   Paying attention or concentrating.   Organizing daily tasks.   Making decisions and solving problems.     Slowness in thinking, acting, speaking, or reading.   Getting lost or easily confused.   Feeling tired all the time or lacking energy (fatigue).   Feeling drowsy.   Sleep disturbances.   Sleeping more than usual.   Sleeping less than usual.   Trouble falling asleep.   Trouble sleeping (insomnia).   Loss of balance or feeling lightheaded or dizzy.   Nausea or vomiting.   Numbness or tingling.   Increased sensitivity to:   Sounds.   Lights.   Distractions.  Other symptoms might include:  Vision problems or eyes that tire easily.   Diminished sense of taste or smell.   Ringing in the ears.   Mood changes such as feeling sad, anxious, or listless.   Becoming easily irritated or angry for little or no reason.   Lack of motivation.  DIAGNOSIS   Your caregiver can usually diagnose a concussion or mild brain injury based on your description of your injury and your symptoms.   Your evaluation might include:  A brain scan to look for signs of injury to the brain. Even if the test shows no injury, you may still have a concussion.   Blood tests to be sure other problems are not present.  TREATMENT    People with a concussion need to be examined and evaluated. Most people with concussions are treated in an emergency department, urgent care, or clinic. Some  people must stay in the hospital overnight for further treatment.   Your caregiver will send you home with important instructions to follow. Be sure to carefully follow them.   Tell your caregiver if you are already taking any medicines (prescription, over-the-counter, or natural remedies), or if you are drinking alcohol or taking illegal drugs. Also, talk with your caregiver if you are taking blood thinners (anticoagulants) or aspirin. These drugs may increase your chances of complications. All of this is important information that may affect treatment.   Only take over-the-counter or prescription medicines for  pain, discomfort, or fever as directed by your caregiver.  PROGNOSIS   How fast people recover from brain injury varies from person to person. Although most people have a good recovery, how quickly they improve depends on many factors. These factors include how severe their concussion was, what part of the brain was injured, their age, and how healthy they were before the concussion.   Because all head injuries are different, so is recovery. Most people with mild injuries recover fully. Recovery can take time. In general, recovery is slower in older persons. Also, persons who have had a concussion in the past or have other medical problems may find that it takes longer to recover from their current injury. Anxiety and depression may also make it harder to adjust to the symptoms of brain injury. HOME CARE INSTRUCTIONS   Return to your normal activities slowly, not all at once. You must give your body and brain enough time for recovery.  Get plenty of sleep at night, and rest during the day. Rest helps the brain to heal.   Avoid staying up late at night.   Keep the same bedtime hours on weekends and weekdays.   Take daytime naps or rest breaks when you feel tired.   Limit activities that require a lot of thought or concentration (brain or cognitive rest). This includes:   Homework or job-related work.   Watching TV.   Computer work.   Avoid activities that could lead to a second brain injury, such as contact or recreational sports, until your caregiver says it is okay. Even after your brain injury has healed, you should protect yourself from having another concussion.   Ask your caregiver when you can return to your normal activities such as driving, bicycling, or operating heavy equipment. Your ability to react may be slower after a brain injury.   Talk with your caregiver about when you can return to work or school.   Inform your teachers, school nurse, school counselor, coach, Pensions consultant, or work Production designer, theatre/television/film about your injury, symptoms, and restrictions. They should be instructed to report:   Increased problems with attention or concentration.   Increased problems remembering or learning new information.   Increased time needed to complete tasks or assignments.   Increased irritability or decreased ability to cope with stress.   Increased symptoms.   Take only those medicines that your caregiver has approved.   Do not drink alcohol until your caregiver says you are well enough to do so. Alcohol and certain other drugs may slow your recovery and can put you at risk of further injury.   If it is harder than usual to remember things, write them down.   If you are easily distracted, try to do one thing at a time. For example, do not try to watch TV while fixing dinner.   Talk with family members or close friends when making important decisions.  Keep all follow-up appointments. Repeated evaluation of your symptoms is recommended for your recovery.  PREVENTION   Protect your head from future injury. It is very important to avoid another head or brain injury before you have recovered. In rare cases, another injury has lead to permanent brain damage, brain swelling, or death. Avoid injuries by using:  Seatbelts when riding in a car.   Alcohol only in moderation.   A helmet when biking, skiing, skateboarding, skating, or doing similar activities.   Safety measures in your home.   Remove clutter and tripping hazards from floors and stairways.   Use grab bars in bathrooms and handrails by stairs.   Place non-slip mats on floors and in bathtubs.   Improve lighting in dim areas.  SEEK MEDICAL CARE IF:   A head injury can cause lingering symptoms. You should seek medical care if you have any of the following symptoms for more than 3 weeks after your injury or are planning to return to sports:  Chronic headaches.   Dizziness or balance problems.   Nausea.    Vision problems.   Increased sensitivity to noise or light.   Depression or mood swings.   Anxiety or irritability.   Memory problems.   Difficulty concentrating or paying attention.   Sleep problems.   Feeling tired all the time.  SEEK IMMEDIATE MEDICAL CARE IF:   You have had a blow or jolt to the head and you (or your family or friends) notice:  Severe or worsening headaches.   Weakness (even if only in one hand or one leg or one part of the face), numbness, or decreased coordination.   Repeated vomiting.   Increased sleepiness or passing out.   One black center of the eye (pupil) is larger than the other.   Convulsions (seizures).   Slurred speech.   Increasing confusion, restlessness, agitation, or irritability.   Lack of ability to recognize people or places.   Neck pain.   Difficulty being awakened.   Unusual behavior changes.   Loss of consciousness.  Older adults with a brain injury may have a higher risk of serious complications such as a blood clot on the brain. Headaches that get worse or an increase in confusion are signs of this complication. If these signs occur, see a caregiver right away. MAKE SURE YOU:    Understand these instructions.   Will watch your condition.   Will get help right away if you are not doing well or get worse.  FOR MORE INFORMATION   Several groups help people with brain injury and their families. They provide information and put people in touch with local resources. These include support groups, rehabilitation services, and a variety of health care professionals. Among these groups, the Brain Injury Association (BIA, www.biausa.org) has a Secretary/administrator that gathers scientific and educational information and works on a national level to help people with brain injury.   Document Released: 12/31/2003 Document Revised: 09/29/2011 Document Reviewed: 05/28/2008 [facility] Patient Information 2012 [place], [place].

## 2012-07-17 NOTE — Progress Notes (Signed)
Subjective:    Patient ID: Kathryn Haynes, female    DOB: [birth year], 78 y.o.   MRN: [id number]  HPI Kathryn Haynes is seen as an emergent work in patient. She was at church Sunday in the fellowship [place]. She lost her footing and fell. Her head hit a wooden ladder, there was no LOC, no  Bleeding. She reports that at home her pupils were equal (she new to look) no nausea or vomiting. She used intermittent ice packs and the scalp swelling resolved. She has a minor tremor, a bit tachycardic. She saw her orthopedic physician today due to her left knee being swollen and painful. There was no evidence of serious injury but she has had a problem with this knee in the past. Dr. Madelon Lips did do x-ray and then injected the knee with steroids. Later today she called, at Dr. Candise Bowens suggestion, for PCP follow-up. CAN was recommending ED evaluation but she was diverted to the office.    Past Medical History:    Reviewed history from 03/01/2008 and no changes required:       LYME DISEASE, HX OF (ICD-V12.09)       GRAVE'S DISEASE, HX OF IN 1978 (ICD-V12.2)       ALLERGIC RHINITIS (ICD-477.9)       PERIPHERAL VASCULAR DISEASE (ICD-443.9)       OSTEOPOROSIS (ICD-733.00)       HYPOTHYROIDISM (ICD-244.9)       PALPITATIONS, HX OF (ICD-V12.50)       HYPERTENSION (ICD-401.9)       VITAMIN D DEFICIENCY (ICD-268.9)       RETINAL DETACHMENT, LEFT EYE, HX OF (ICD-V12.49)       GLAUCOMA (ICD-365.9)  Past Surgical History:    Reviewed history from 03/01/2008 and no changes required:       * Hx of VIRECTOMY       ROTATOR CUFF REPAIR, HX OF (ICD-V45.89)       DILATION AND CURETTAGE, HX OF (ICD-V45.89)       TONSILLECTOMY, HX OF (ICD-V45.79)       * BROKEN TOOTH WITH STAGE 4 IMPLANT WITH BONE GRAFT.       * Hx of REPAIR OF TORN ANTERIOR LEFT DISTAL LOWER EXTREMITY       ARTHROSCOPY, LEFT KNEE, HX OF (ICD-V45.89)           Family History:    Mother, Grandmother and sister with osteoporosis  Social History:  Patient and husband doing well.    Patient states that they are always on the go.  Current Outpatient Prescriptions on File Prior to Visit  Medication Sig Dispense Refill  . amoxicillin (AMOXIL) 500 MG capsule Take 500 mg by mouth 3 (three) times daily. Till completed for oral surgery      . Calcium Carb-Cholecalciferol (CALCIUM-VITAMIN D) 600-400 MG-UNIT TABS Take 1 tablet by mouth 2 (two) times daily.      Marland Kitchen ibuprofen (ADVIL,MOTRIN) 200 MG tablet May take 4 tablets as needed for pain with oral surgery pain      . LOPRESSOR 50 MG tablet TAKE 1 TABLET BY MOUTH TWICE DAILY.  60 tablet  3  . Multiple Vitamins-Minerals (CENTRUM PO) Take 1 tablet by mouth daily.      . risedronate (ACTONEL) 150 MG tablet Take 150 mg by mouth every 30 (thirty) days. with water on empty stomach, nothing by mouth or lie down for next 30 minutes.      Marland Kitchen SYNTHROID 125 MCG tablet TAKE 1 TABLET ONCE DAILY.  30 each  5  . DISCONTD: LOPRESSOR 50 MG tablet TAKE 1 TABLET BY MOUTH TWICE DAILY.  60 each  11         Review of Systems System review is negative for any constitutional, cardiac, pulmonary, GI or neuro symptoms or complaints other than as described in the HPI.     Objective:   Physical Exam Filed Vitals:   07/17/12 1817  BP: 144/80  Pulse: 74  Temp: 97.7 F (36.5 C)  Resp: 12   Gen'l - older white woman in no acute distress HEENT- no signs of trauma: no scalp lesion, no swelling, no Battle's sign, no Racoon's eyes. Neck - supple Cor - RRR III/VI systolic mm best at RSB Pulm - normal respirations Chest wall - moild kyphosis Neuro - CN II-XII normal: normal facial symmetry and movement, PERRLA, EOMI, no nystagmus. MS - 4/5 but equal. DTRs normal, finger to nose normal, rapid finger movement normal, heel to shin OK right to left, hinder by left knee on the reverse. Cerebellar - fine tremor mild, poor balance, poor tandem gait. Able to say tongue twisters. Admits to increased trouble with word finding.      CT head w/o contrast to r/o SDH: IMPRESSION:  Negative for age noncontrast CT appearance of the brain.     Assessment & Plan:  Head trauma - blunt trauma with possible mild concussion that has lead to increased imbalance and mild increase in word finding and name recall function. No SDH or injury on CT head.  Plan - observation and return for any progressive neurologic change

## 2012-07-18 ENCOUNTER — Telehealth: Payer: Self-pay | Admitting: *Deleted

## 2012-07-18 NOTE — Telephone Encounter (Signed)
CALLED PATIENT TO SEE HOW SHE WAS DOING. PATIENT STATES IS OK. HAVING SOME SHAKINESS AND SLIGHT HEADACHE. SOME DIZZY WHEN WALKING BUT IS NO DIFFERENT FROM THE PAST COUPLE DAYS. NO NEW SYMPTOMS NOTED TODAY.  PATIENT NOTIFIED OF NORMAL HEAD CT EXAM. PATIENT VERY THANKFUL FOR THE CARE SHE HAS RECEIVED FROM EVERYONE.

## 2012-07-23 ENCOUNTER — Ambulatory Visit: Payer: Medicare Other | Admitting: Internal Medicine

## 2012-08-03 ENCOUNTER — Encounter: Payer: Self-pay | Admitting: Internal Medicine

## 2012-08-10 ENCOUNTER — Other Ambulatory Visit: Payer: Self-pay | Admitting: Internal Medicine

## 2012-11-19 ENCOUNTER — Other Ambulatory Visit: Payer: Self-pay | Admitting: Internal Medicine

## 2012-12-24 ENCOUNTER — Other Ambulatory Visit: Payer: Self-pay | Admitting: Internal Medicine

## 2013-02-26 ENCOUNTER — Ambulatory Visit: Payer: Self-pay | Admitting: Ophthalmology

## 2013-02-26 LAB — CBC WITH DIFFERENTIAL/PLATELET
Basophil #: 0 10*3/uL (ref 0.0–0.1)
Basophil %: 0.7 %
Eosinophil #: 0.1 10*3/uL (ref 0.0–0.7)
Eosinophil %: 1.1 %
HCT: 36 % (ref 35.0–47.0)
HGB: 12.4 g/dL (ref 12.0–16.0)
Lymphocyte %: 26.6 %
MCH: 31 pg (ref 26.0–34.0)
MCV: 90 fL (ref 80–100)
Monocyte #: 0.4 x10 3/mm (ref 0.2–0.9)
Neutrophil %: 63 %
WBC: 4.4 10*3/uL (ref 3.6–11.0)

## 2013-02-26 LAB — BASIC METABOLIC PANEL
Anion Gap: 3 — ABNORMAL LOW (ref 7–16)
BUN: 19 mg/dL — ABNORMAL HIGH (ref 7–18)
Calcium, Total: 9.2 mg/dL (ref 8.5–10.1)
Chloride: 109 mmol/L — ABNORMAL HIGH (ref 98–107)
Co2: 30 mmol/L (ref 21–32)
Creatinine: 0.73 mg/dL (ref 0.60–1.30)
EGFR (Non-African Amer.): >60
Potassium: 4.4 mmol/L (ref 3.5–5.1)

## 2013-03-06 ENCOUNTER — Ambulatory Visit: Payer: Self-pay | Admitting: Ophthalmology

## 2013-04-19 ENCOUNTER — Other Ambulatory Visit: Payer: Self-pay | Admitting: Internal Medicine

## 2013-05-20 ENCOUNTER — Other Ambulatory Visit: Payer: Self-pay | Admitting: Internal Medicine

## 2013-07-16 ENCOUNTER — Ambulatory Visit (INDEPENDENT_AMBULATORY_CARE_PROVIDER_SITE_OTHER): Payer: Medicare Other | Admitting: Internal Medicine

## 2013-07-16 ENCOUNTER — Encounter: Payer: Self-pay | Admitting: Internal Medicine

## 2013-07-16 ENCOUNTER — Other Ambulatory Visit (INDEPENDENT_AMBULATORY_CARE_PROVIDER_SITE_OTHER): Payer: Medicare Other

## 2013-07-16 ENCOUNTER — Ambulatory Visit (INDEPENDENT_AMBULATORY_CARE_PROVIDER_SITE_OTHER)
Admission: RE | Admit: 2013-07-16 | Discharge: 2013-07-16 | Disposition: A | Discharge status: Home / Self Care | Payer: Medicare Other | Source: Ambulatory Visit | Attending: Internal Medicine | Admitting: Internal Medicine

## 2013-07-16 VITALS — BP 140/72 | HR 83 | Temp 96.8°F | Wt 118.0 lb

## 2013-07-16 DIAGNOSIS — I350 Nonrheumatic aortic (valve) stenosis: Secondary | ICD-10-CM

## 2013-07-16 DIAGNOSIS — IMO0002 Reserved for concepts with insufficient information to code with codable children: Secondary | ICD-10-CM

## 2013-07-16 DIAGNOSIS — R634 Abnormal weight loss: Secondary | ICD-10-CM

## 2013-07-16 DIAGNOSIS — N8111 Cystocele, midline: Secondary | ICD-10-CM

## 2013-07-16 DIAGNOSIS — I359 Nonrheumatic aortic valve disorder, unspecified: Secondary | ICD-10-CM

## 2013-07-16 LAB — COMPREHENSIVE METABOLIC PANEL
ALT: 14 U/L (ref 0–35)
AST: 18 U/L (ref 0–37)
Albumin: 3.7 g/dL (ref 3.5–5.2)
Alkaline Phosphatase: 42 U/L (ref 39–117)
Calcium: 9.2 mg/dL (ref 8.4–10.5)
Chloride: 103 mEq/L (ref 96–112)
Creatinine, Ser: 0.9 mg/dL (ref 0.4–1.2)
Potassium: 4.3 mEq/L (ref 3.5–5.1)
Sodium: 139 mEq/L (ref 135–145)
Total Protein: 6.6 g/dL (ref 6.0–8.3)

## 2013-07-16 LAB — CBC WITH DIFFERENTIAL/PLATELET
Basophils Absolute: 0 10*3/uL (ref 0.0–0.1)
Eosinophils Absolute: 0.1 10*3/uL (ref 0.0–0.7)
HCT: 36.7 % (ref 36.0–46.0)
Hemoglobin: 12.4 g/dL (ref 12.0–15.0)
Lymphocytes Relative: 22.3 % (ref 12.0–46.0)
Lymphs Abs: 1.6 10*3/uL (ref 0.7–4.0)
MCHC: 33.8 g/dL (ref 30.0–36.0)
Neutro Abs: 4.9 10*3/uL (ref 1.4–7.7)
Platelets: 234 10*3/uL (ref 150.0–400.0)
RDW: 14.7 % — ABNORMAL HIGH (ref 11.5–14.6)

## 2013-07-16 LAB — HEPATIC FUNCTION PANEL
Albumin: 3.7 g/dL (ref 3.5–5.2)
Alkaline Phosphatase: 42 U/L (ref 39–117)
Total Protein: 6.6 g/dL (ref 6.0–8.3)

## 2013-07-16 LAB — TSH: TSH: 0.12 u[IU]/mL — ABNORMAL LOW (ref 0.35–5.50)

## 2013-07-16 LAB — T4, FREE: Free T4: 1.2 ng/dL (ref 0.60–1.60)

## 2013-07-16 LAB — HIGH SENSITIVITY CRP: CRP, High Sensitivity: 0.31 mg/L (ref 0.000–5.000)

## 2013-07-16 LAB — VITAMIN B12: Vitamin B-12: 590 pg/mL (ref 211–911)

## 2013-07-16 NOTE — Progress Notes (Signed)
Subjective:    Patient ID: Kathryn Haynes, female    DOB: 08-16-1929, 79 y.o.   MRN: [id number]  HPI Mrs. Teschner presents to discuss proposed sling adjustment for persistent incontinence, visible cystocele bulge with standing and  with failure of anticholinestrase inhibitors. The surgery is proposed under local anesthetics and she is concerned about the use "caines" or other products to which she has had severe allergic reactions. Her surgeon is aware of her allergies.  She has had an attempt at bone graft for tooth  implant - had a drug reaction to the local anesthetic.  She has had knee injections: steroids w/o anesthetics. She has tolerated this well.   She is concerned about a tremor of the hands that developed over the past 6 months. She does not relate any problem with gait or balance.-  She has lost 40 lbs over 2 years; 17 lbs since Sept '13. She reports that she has adequate calorie intake. Last colonoscopy '08 - normal by patient's report.   Past Medical History  Diagnosis Date  . Personal history of other infectious and parasitic disease     lyme disease  . Graves disease 1978  . Allergy     allergic rhinitis  . Peripheral vascular disease, unspecified   . Osteoporosis, unspecified   . Unspecified hypothyroidism   . Personal history of unspecified circulatory disease   . Hypertension   . Unspecified psychosis     vitamin D Deficiency  . Personal history of other disorders of nervous system and sense organs     retinal detachment, left eye  . Glaucoma    Past Surgical History  Procedure Laterality Date  . Roator cuff repair    . Dilation and curettage of uterus    . Tonsillectomy    . Dental surgery      broken tooth with stage 4 implant with bone graft  . Arhtoscopy left knee    . Abdominal hysterectomy     Family History  Problem Relation Age of Onset  . Osteoporosis Mother   . Osteoporosis Sister   . Osteoporosis Maternal Grandmother    History   Social  History  . Marital Status: Married    Spouse Name: N/A    Number of Children: N/A  . Years of Education: N/A   Occupational History  . Not on file.   Social History Main Topics  . Smoking status: Never Smoker   . Smokeless tobacco: Not on file  . Alcohol Use: Not on file  . Drug Use: Not on file  . Sexual Activity: Not on file   Other Topics Concern  . Not on file   Social History Narrative   PATIENT AND HUSBAND DOING WILL   PATIENT STATES THAT THEY ARE ALWAYS ON THE GO     Current Outpatient Prescriptions on File Prior to Visit  Medication Sig Dispense Refill  . Calcium Carb-Cholecalciferol (CALCIUM-VITAMIN D) 600-400 MG-UNIT TABS Take 1 tablet by mouth 2 (two) times daily.      Marland Kitchen ibuprofen (ADVIL,MOTRIN) 200 MG tablet May take 4 tablets as needed for pain with oral surgery pain      . metoprolol (LOPRESSOR) 50 MG tablet TAKE 1 TABLET BY MOUTH TWICE DAILY.  60 tablet  1  . Multiple Vitamins-Minerals (CENTRUM PO) Take 1 tablet by mouth daily.      Marland Kitchen SYNTHROID 125 MCG tablet TAKE 1 TABLET ONCE DAILY.  30 tablet  3   No current facility-administered medications on file  prior to visit.        Review of Systems System review is negative for any constitutional, cardiac, pulmonary, GI or neuro symptoms or complaints other than as described in the HPI.     Objective:   Physical Exam Filed Vitals:   07/16/13 1438  BP: 140/72  Pulse: 83  Temp: 96.8 F (36 C)   Wt Readings from Last 3 Encounters:  07/16/13 118 lb (53.524 kg)  06/28/12 135 lb (61.236 kg)  12/21/10 158 lb (71.668 kg)   Gen'l - elderly woman with noticeable weight loss HEENT- facial thinning, mild temporal wasting Cor- RRR Pulm - normal respirations Neuro - A&O x 3, normal gait       Assessment & Plan:

## 2013-07-16 NOTE — Patient Instructions (Addendum)
1. Bladder sling adjustment surgery and the risk of local anesthetics: With all other treatments having failed and with your activities being curtailed by incontinence there is definite value to having the surgery.  Plan I recommend that you have trust in your surgeon. If he knows of your allergy history and feels he can still successfully operate, and he will have an anesthesiologist present then it is most likely safe.  2. Weight loss: 40 lbs in 2 years, 17 labs since Sept '13. This is too much weight to be attributed to fluid losses from the urinary tract. Plan Lab: to check thyroid, blood counts, liver functions, kidney function - to be sure all is well.  Chest x-ray  Calorie count: write down everything you eat or drink - with estimated portion size- for 5 days. Throw away days 1-2. Analyze days 3-5: count the calories  3. Heart - on exam a very prominent aortic murmur cc/w aortic stenosis which does need to be followed on a regular basis.  2 D echo at [facility] in may '13 revealed moderate aortic stenosis with mild regurgitation and normal ventricular function.

## 2013-07-17 DIAGNOSIS — I350 Nonrheumatic aortic (valve) stenosis: Secondary | ICD-10-CM | POA: Insufficient documentation

## 2013-07-17 NOTE — Assessment & Plan Note (Signed)
Weight loss: 40 lbs in 2 years, 17 labs since Sept '13. This is too much weight to be attributed to fluid losses from the urinary tract.  Plan Lab: to check thyroid, blood counts, liver functions, kidney function - to be sure all is well.  Chest x-ray  Calorie count: write down everything you eat or drink - with estimated portion size- for 5 days. Throw away days 1-2. Analyze days 3-5: count the calories, report back

## 2013-07-17 NOTE — Assessment & Plan Note (Signed)
Bladder sling adjustment surgery and the risk of local anesthetics: With all other treatments having failed and with your activities being curtailed by incontinence there is definite value to having the surgery.   Plan I recommend that you have trust in your surgeon. If he knows of your allergy history and feels he can still successfully operate, and he will have an anesthesiologist present then it is most likely safe.

## 2013-07-17 NOTE — Assessment & Plan Note (Signed)
Patient with aortic stenosis. She is followed by Cardiology in [place] but she doesn't recall being informed of a murmur or valvular disorder.   Plan Refer back to her cardiologist - may want to consider follow up 2 D echo to assess valve gradient, LV size and function.

## 2013-07-18 ENCOUNTER — Telehealth: Payer: Self-pay | Admitting: Internal Medicine

## 2013-07-18 ENCOUNTER — Other Ambulatory Visit: Payer: Self-pay | Admitting: Internal Medicine

## 2013-07-18 NOTE — Telephone Encounter (Signed)
Rec'd from [facility] forward 10 pages to Dr. Debby Bud

## 2013-07-19 ENCOUNTER — Encounter: Payer: Self-pay | Admitting: Internal Medicine

## 2013-07-19 DIAGNOSIS — E039 Hypothyroidism, unspecified: Secondary | ICD-10-CM

## 2013-07-19 MED ORDER — LEVOTHYROXINE SODIUM 112 MCG PO TABS: 112.0000 ug | ORAL_TABLET | Freq: Every day | ORAL | Status: DC

## 2013-10-08 ENCOUNTER — Telehealth: Payer: Self-pay

## 2013-10-08 ENCOUNTER — Encounter: Payer: Self-pay | Admitting: Podiatrist

## 2013-10-08 ENCOUNTER — Ambulatory Visit (INDEPENDENT_AMBULATORY_CARE_PROVIDER_SITE_OTHER): Payer: Medicare Other | Admitting: Podiatrist

## 2013-10-08 VITALS — BP 104/57 | HR 77 | Resp 16

## 2013-10-08 DIAGNOSIS — M79609 Pain in unspecified limb: Secondary | ICD-10-CM

## 2013-10-08 DIAGNOSIS — B351 Tinea unguium: Secondary | ICD-10-CM

## 2013-10-08 NOTE — Progress Notes (Signed)
   HPI:  Patient presents today for follow up of foot and nail care. Denies any new complaints today.  Objective:  Patients chart is reviewed.  Neurovascular status unchanged.  Patients nails are thickened, discolored, distrophic, friable and brittle with yellow-brown discoloration. Patient subjectively relates they are painful with shoes and with ambulation of bilateral feet. Left hallux nail is finally growing out more normally-- was injured 3 years ago.    Assessment:  Symptomatic onychomycosis  Plan:  Discussed treatment options and alternatives.  The symptomatic toenails were debrided through manual an mechanical means without complication.  Return appointment recommended at routine intervals of 3 months    Marlowe Aschoff, DPM

## 2013-10-08 NOTE — Telephone Encounter (Signed)
Phone call from Dub Mikes NP at [facility] [phone] calling on behalf of Dr Vanessa Barbara, calling to see if it's okay for patient to start Myrbetriq 25 mg qhs for urinary incontinence. If so they will initiate patient on this med. Patient will f/u with them in 1 month.

## 2013-10-08 NOTE — Telephone Encounter (Signed)
KPeri Jefferson choice - lowest side effects.

## 2013-10-09 NOTE — Telephone Encounter (Signed)
I spoke to someone in the office and she will give Amy Adron Bene the message that Dr Debby Bud thinks it's a good choice for patient to be started on Myrbetriq 25 mg

## 2014-01-07 ENCOUNTER — Ambulatory Visit: Payer: Medicare Other | Admitting: Podiatrist

## 2014-01-20 ENCOUNTER — Other Ambulatory Visit: Payer: Self-pay | Admitting: Internal Medicine

## 2014-03-04 ENCOUNTER — Encounter: Payer: Self-pay | Admitting: Podiatrist

## 2014-03-04 ENCOUNTER — Ambulatory Visit (INDEPENDENT_AMBULATORY_CARE_PROVIDER_SITE_OTHER): Payer: Medicare Other | Admitting: Podiatrist

## 2014-03-04 VITALS — BP 117/68 | HR 77 | Resp 18

## 2014-03-04 DIAGNOSIS — B351 Tinea unguium: Secondary | ICD-10-CM

## 2014-03-04 DIAGNOSIS — M79609 Pain in unspecified limb: Secondary | ICD-10-CM

## 2014-03-04 NOTE — Progress Notes (Signed)
I am here to get my toenails trimmed up and I have had eye surgery and fell and I had to have hip surgery on my right  HPI:  Patient presents today for follow up of foot and nail care. Denies any new complaints today.  Objective:  Patients chart is reviewed.  Vascular status reveals pedal pulses noted at 1 out of 4 dp and pt bilateral .  Neurological sensation is Normal to [facility]il[facility] bilateral.  Patients nails are thickened, discolored, distrophic, friable and brittle with yellow-brown discoloration. Patient subjectively relates they are painful with shoes and with ambulation of bilateral feet.  Assessment:  Symptomatic onychomycosis  Plan:  Discussed treatment options and alternatives.  The symptomatic toenails were debrided through manual an mechanical means without complication.  Return appointment recommended at routine intervals of 3 months    Kathryn AschoffKathryn Rmani Haynes, DPM

## 2014-04-04 IMAGING — CT CT HEAD W/O CM
2 series · 15 of 30 positions shown, 19 images · non-contrast
Comparison: None.

CLINICAL DATA: 83-year-old female status post blunt trauma on
07/15/2012.  Decreased balance, word finding difficulty.

CT HEAD WITHOUT CONTRAST
TECHNIQUE: Contiguous axial images were obtained from the base of
the skull through the vertex without contrast.

[Series 2: head w/o · axial · non-contrast · 0.44mm/px · z∈[-211,-86]mm · 13 of 31 slices shown, 17 images]
[im 3/31  brain]
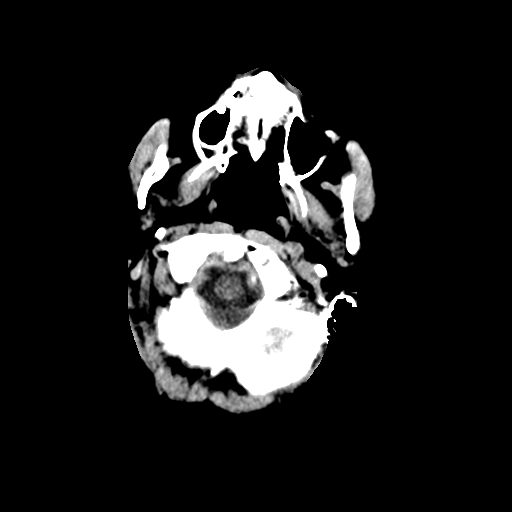
[im 3/31  bone]
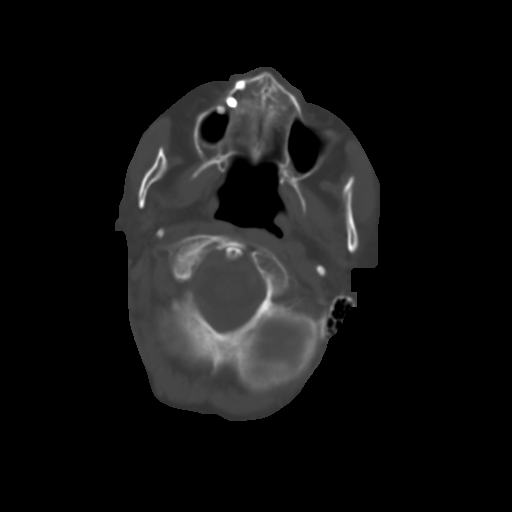
[im 5/31  brain]
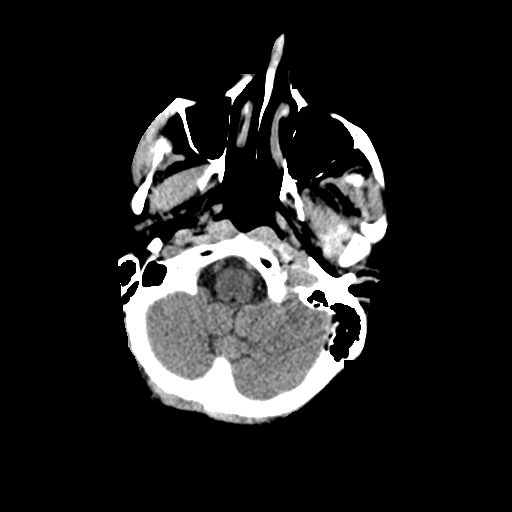
[im 7/31  brain]
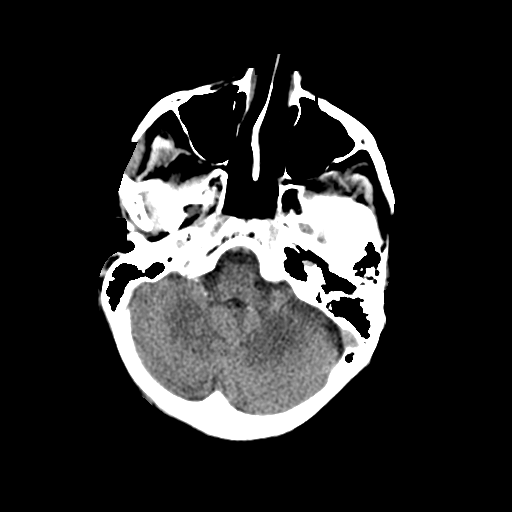
[im 9/31  brain]
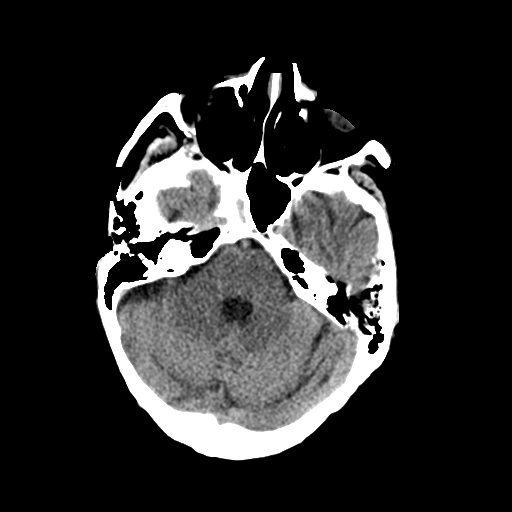
[im 11/31  brain]
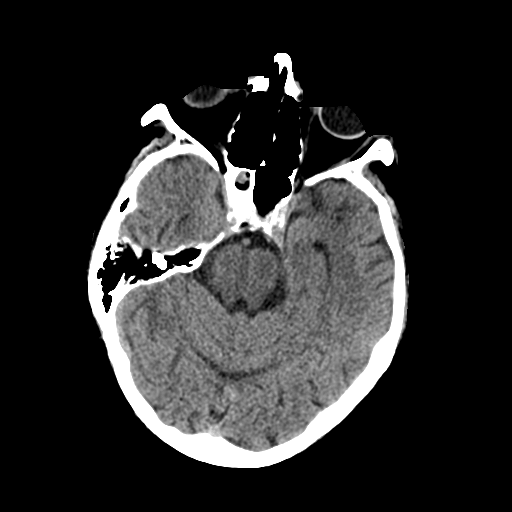
[im 11/31  bone]
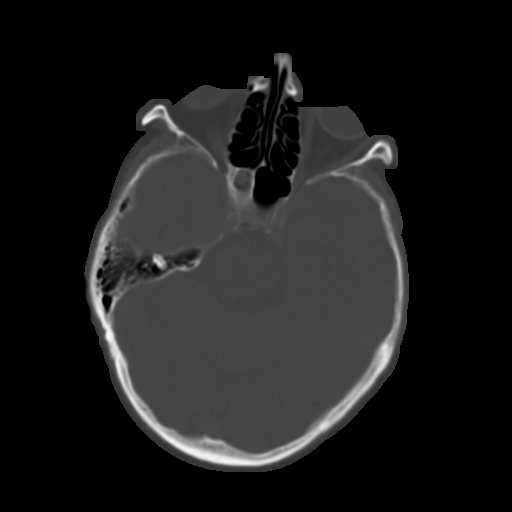
[im 13/31  brain]
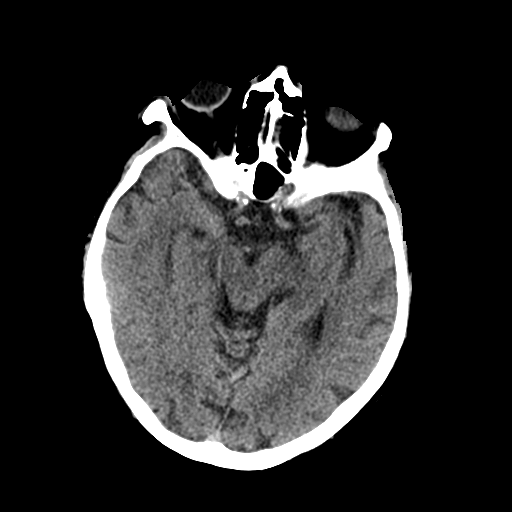
[im 16/31  brain]
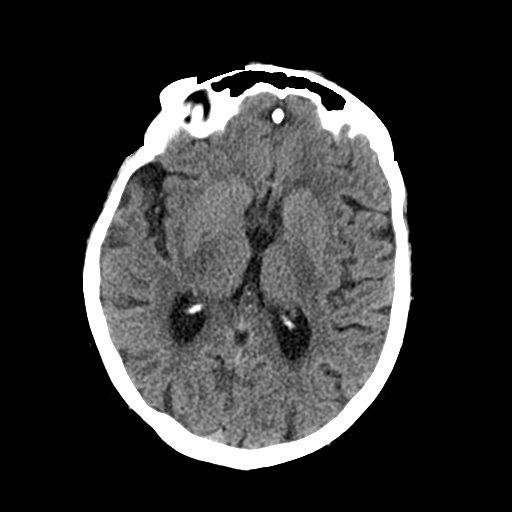
[im 18/31  brain]
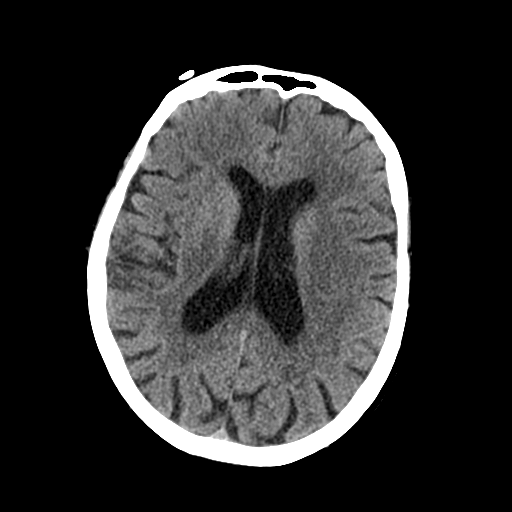
[im 20/31  brain]
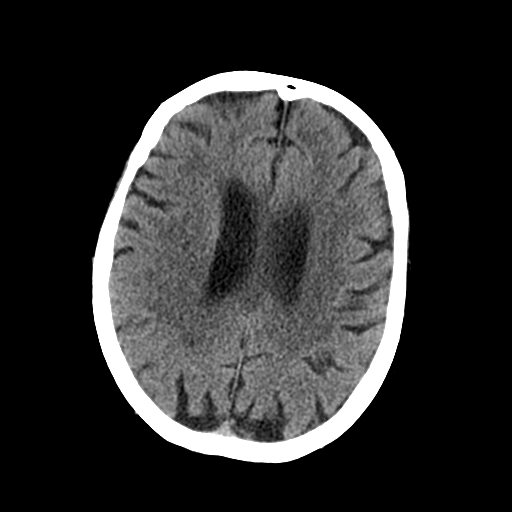
[im 20/31  bone]
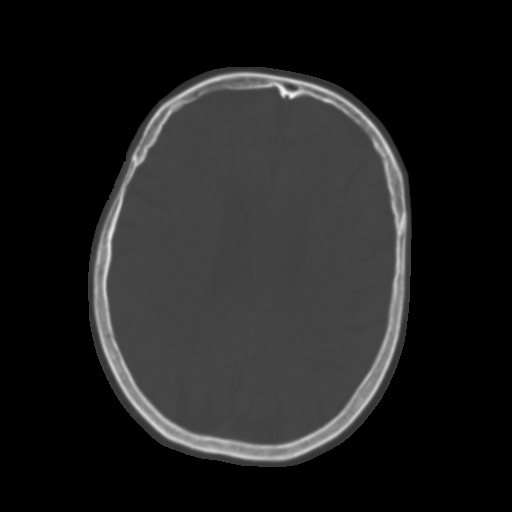
[im 22/31  brain]
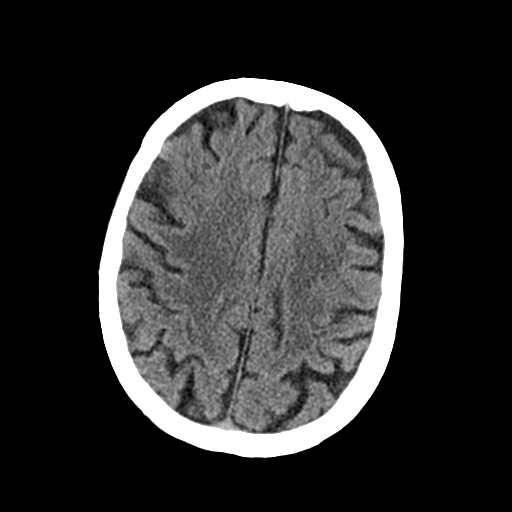
[im 24/31  brain]
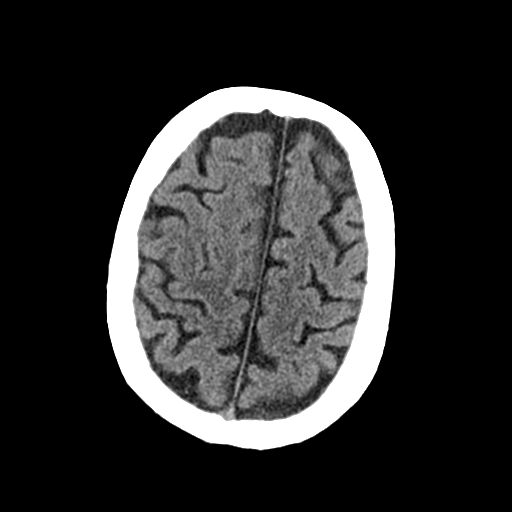
[im 26/31  brain]
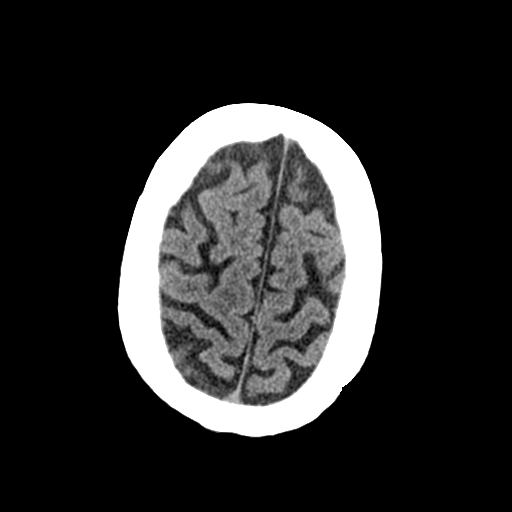
[im 28/31  brain]
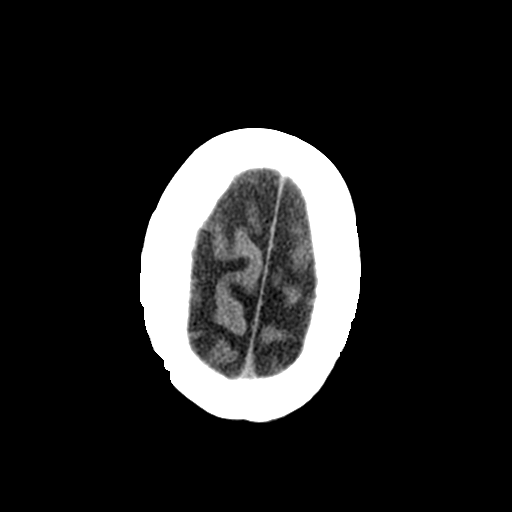
[im 28/31  bone]
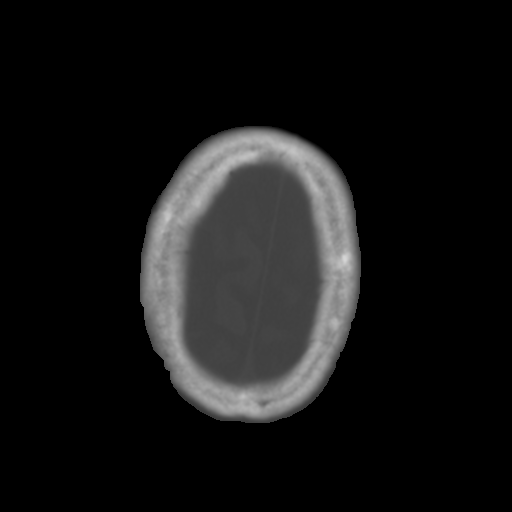

[Series 3: bone windows · axial · 0.44mm/px · z∈[-211,-191]mm · 2 of 31 slices shown]
[im 3/31  bone]
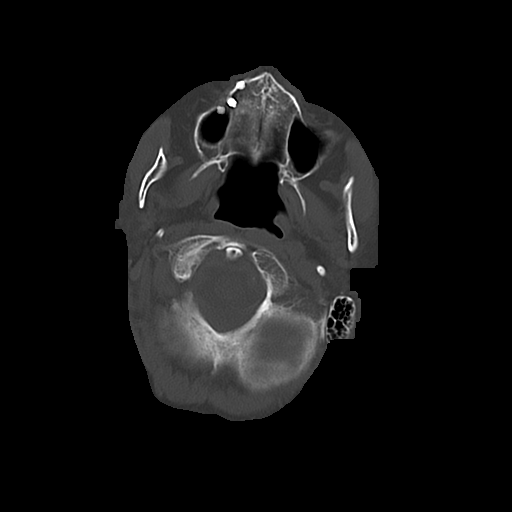
[im 7/31  bone]
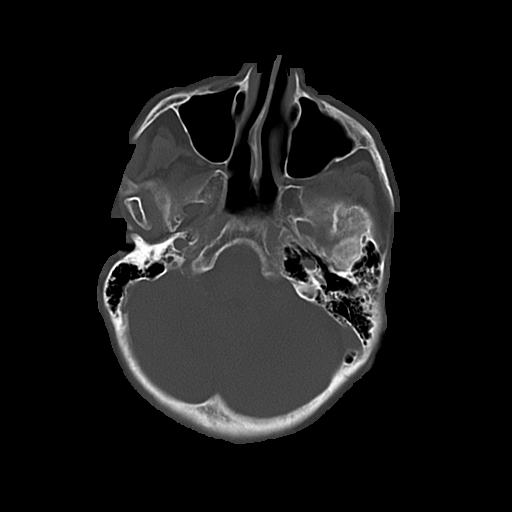

[15 of 30 positions shown; findings below may reference images not displayed]

FINDINGS: Near total opacification of the right sphenoid sinus with
mucoperiosteal thickening.  Other Visualized paranasal sinuses and
mastoids are clear.  Osteopenia.  Calvarium intact.  Chronic-
appearing ossific fragment anterior to the right frontal bone in
the scalp on series 3 image 18.  No scalp hematoma identified.
Postoperative changes to the globes.

Mild Calcified atherosclerosis at the skull base.  No
ventriculomegaly. No midline shift, mass effect, or evidence of
mass lesion.  No acute intracranial hemorrhage identified.  No
evidence of cortically based acute infarction identified.  Mild for
age cerebral white matter hypodensity. No suspicious intracranial
vascular hyperdensity.
IMPRESSION: Negative for age noncontrast CT appearance of the brain.

## 2014-06-10 ENCOUNTER — Ambulatory Visit (INDEPENDENT_AMBULATORY_CARE_PROVIDER_SITE_OTHER): Payer: Medicare Other | Admitting: Podiatrist

## 2014-06-10 ENCOUNTER — Encounter: Payer: Self-pay | Admitting: Podiatrist

## 2014-06-10 VITALS — BP 112/60 | HR 80 | Resp 18

## 2014-06-10 DIAGNOSIS — M79676 Pain in unspecified toe(s): Principal | ICD-10-CM

## 2014-06-10 DIAGNOSIS — M79609 Pain in unspecified limb: Secondary | ICD-10-CM

## 2014-06-10 DIAGNOSIS — B351 Tinea unguium: Secondary | ICD-10-CM

## 2014-06-12 NOTE — Progress Notes (Signed)
HPI: Patient presents today for follow up of foot and nail care. Denies any new complaints today.  Objective: Patients chart is reviewed. Vascular status reveals pedal pulses noted at 1 out of 4 dp and pt bilateral . Neurological sensation is Normal to Semmes Weinstein monofilament bilateral. Patients nails are thickened, discolored, distrophic, friable and brittle with yellow-brown discoloration. Patient subjectively relates they are painful with shoes and with ambulation of bilateral feet.  Assessment: Symptomatic onychomycosis  Plan: Discussed treatment options and alternatives. The symptomatic toenails were debrided through manual an mechanical means without complication. Return appointment recommended at routine intervals of 3 months   

## 2014-08-24 DEATH — deceased

## 2014-09-15 ENCOUNTER — Ambulatory Visit: Payer: Medicare Other

## 2014-09-16 ENCOUNTER — Ambulatory Visit: Payer: Medicare Other | Admitting: Podiatrist

## 2015-02-13 NOTE — Op Note (Signed)
PATIENT NAME:  Kathryn Haynes, Briseida MR#:  [id number] DATE OF BIRTH:  01/11/1929  DATE OF PROCEDURE:  03/06/2013  PREOPERATIVE DIAGNOSIS:  Senile cataract right eye.  POSTOPERATIVE DIAGNOSIS:  Senile cataract right eye.  PROCEDURE:  Phacoemulsification with posterior chamber intraocular lens implantation of the right eye.  LENS:  SN60WF 29.5-diopter posterior chamber intraocular lens.  ULTRASOUND TIME:  13% of 1 minute, 12 seconds.  CDE 9.4.  SURGEON:  [place]had Gwendoline Judy, MD  ANESTHESIA:  General endotracheal with tetracaine drops and 2% Xylocaine jelly.  COMPLICATIONS:  None.  DESCRIPTION OF PROCEDURE:  The patient was identified in the holding room and transported to the operating room and placed in the supine position under the operating microscope.  The right eye was identified as the operative eye.  General endotracheal anesthesia was induced without complication. The right eye was prepped and draped in the usual sterile ophthalmic fashion.   A 1 millimeter clear-corneal paracentesis was made at the 12 o'clock position.  The anterior chamber was filled with Viscoat.  A 2.4 millimeter keratome was used to make a near-clear corneal incision at the 9 o'clock position.  A curvilinear capsulorrhexis was made with a cystotome and capsulorrhexis forceps.  Balanced salt solution was used to hydrodissect and hydrodelineate the nucleus.  Phacoemulsification was then used in stop and chop fashion to remove the lens nucleus and epinucleus.  The remaining cortex was then removed using the irrigation and aspiration handpiece. Provisc was then placed into the capsular bag to distend it for lens placement.  A SN60WF 29.5-diopter lens was then injected into the capsular bag.  The remaining viscoelastic was aspirated.  Wounds were hydrated with balanced salt solution.  The anterior chamber was inflated to a physiologic pressure with balanced salt solution.  0.1 mL of cefuroxime 10 mg/mL were injected into the  anterior chamber for a dose of 1 mg of intracameral antibiotic at the completion of the case. Miostat was placed into the anterior chamber to constrict the pupil.  No wound leaks were noted.  Topical Vigamox drops and Maxitrol ointment were applied to the eye.  The patient was awakened from general anesthesia and transferred to the PACU in stable condition.  ____________________________ Deirdre Evenerhadwick R. Kimm Sider, MD crb:cs D: 03/06/2013 14:50:55 ET T: 03/06/2013 15:32:27 ET JOB#: [id number]  cc: Deirdre Evenerhadwick R. Alessia Gonsalez, MD, <Dictator> Lockie MolaHADWICK Keyunna Coco MD ELECTRONICALLY SIGNED 03/13/2013 12:14

## 2015-04-03 IMAGING — CR DG CHEST 2V
2 series · 2 of 2 positions shown · non-contrast
Comparison: None

CLINICAL DATA: Weight loss, history hypertension

EXAM:
CHEST  2 VIEW

[view not recorded (1 of 2)]
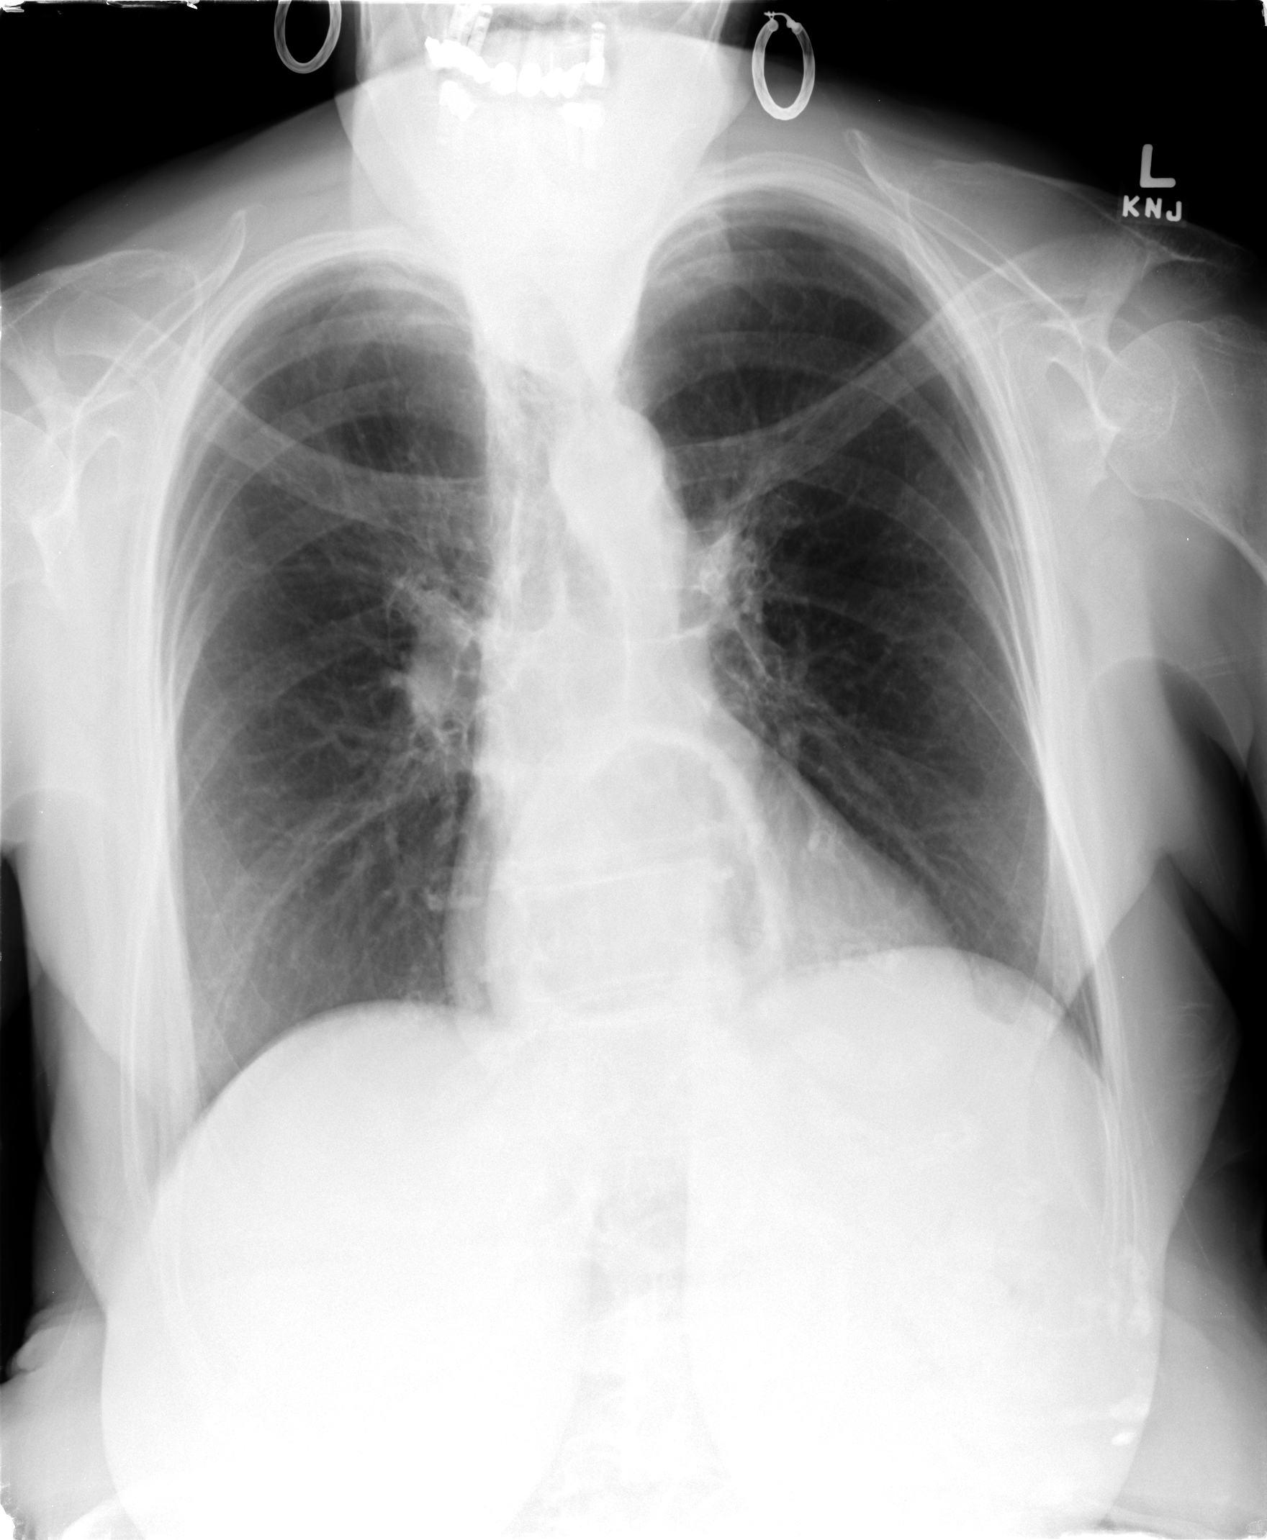

[view not recorded (2 of 2)]
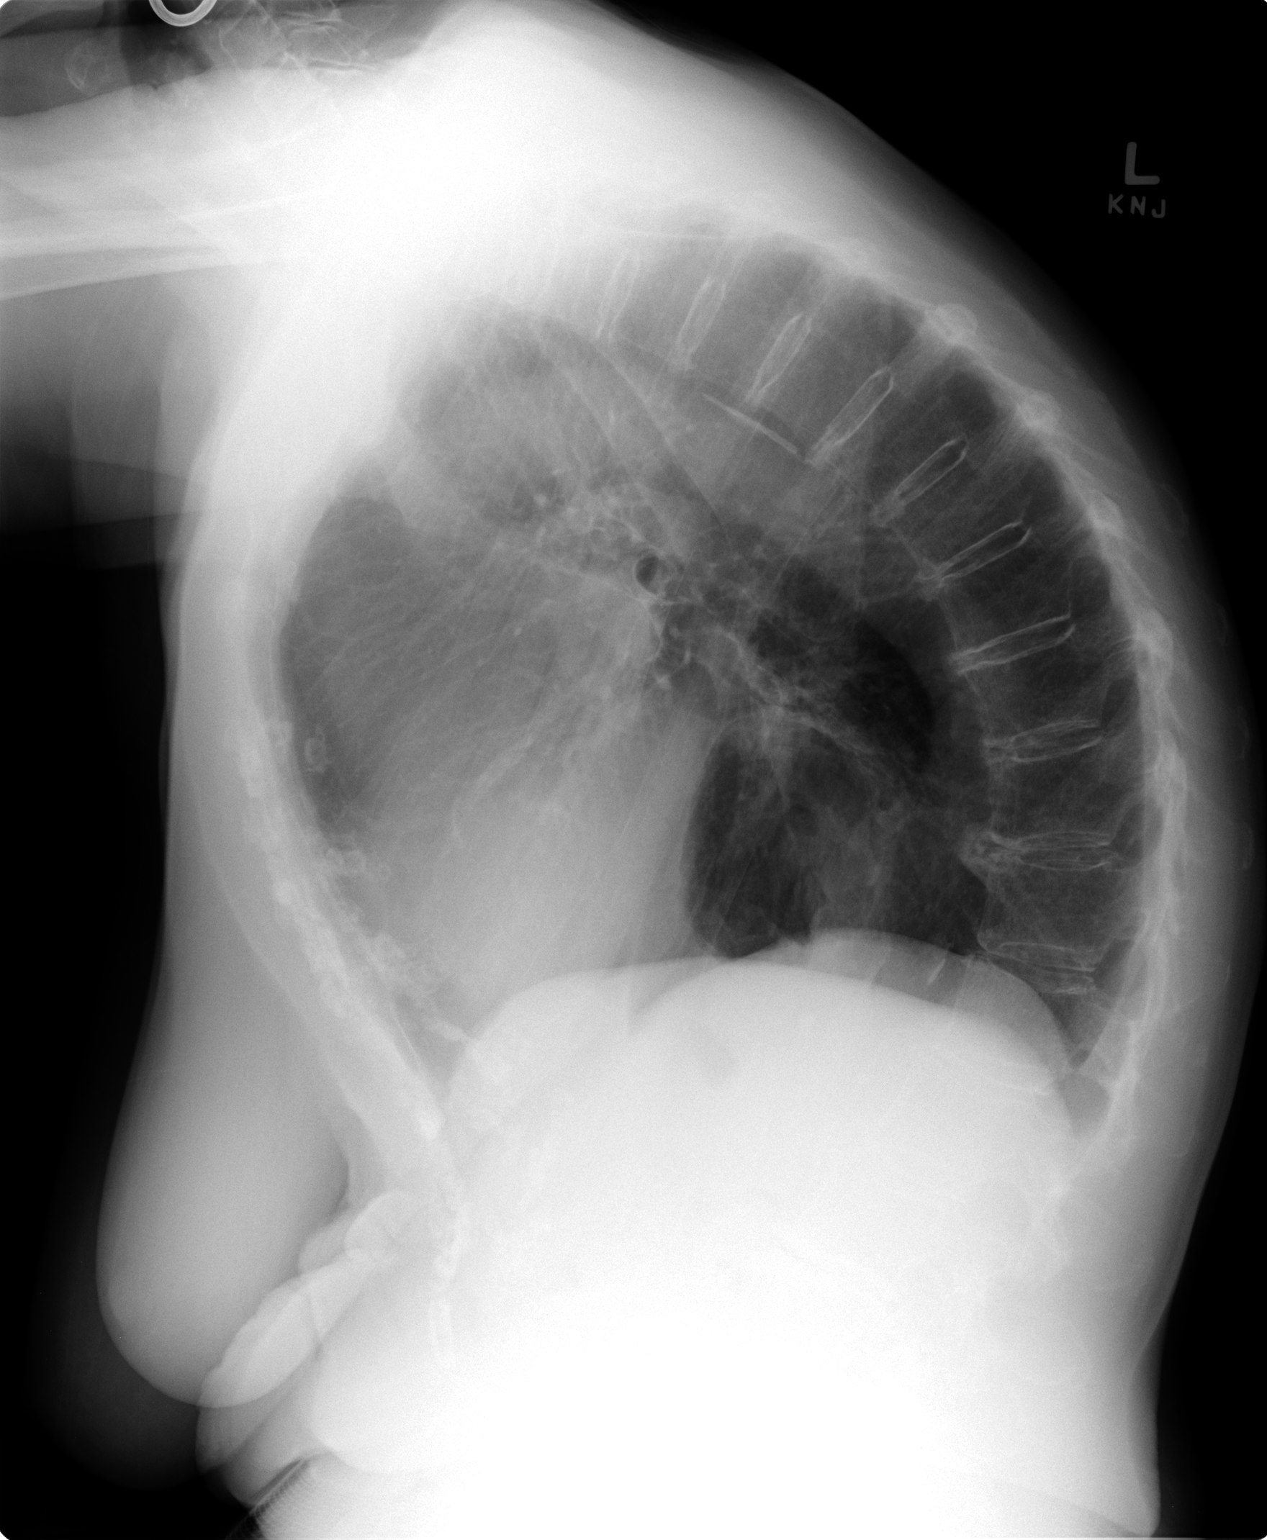

[2 of 2 positions shown; findings below may reference images not displayed]

FINDINGS: Borderline enlargement of cardiac silhouette.

Large hiatal hernia.

Calcified tortuous thoracic aorta.

Pulmonary vascularity normal.

Emphysematous and bronchitic changes consistent with COPD.

No acute infiltrate, pleural effusion or pneumothorax.

Osseous demineralization with thoracic kyphosis.
IMPRESSION: Large hiatal hernia.

COPD changes.

Borderline enlargement of cardiac silhouette.

No acute abnormalities.
